# Patient Record
Sex: Female | Born: 1940 | Race: White | Hispanic: No | Marital: Married | Smoking: Never smoker
Health system: Southern US, Community
[De-identification: ages and names within clinical notes are randomized; demographics above are authoritative.]

## PROBLEM LIST (undated history)

## (undated) DIAGNOSIS — N912 Amenorrhea, unspecified: Secondary | ICD-10-CM

## (undated) DIAGNOSIS — I10 Essential (primary) hypertension: Secondary | ICD-10-CM

## (undated) DIAGNOSIS — M1990 Unspecified osteoarthritis, unspecified site: Secondary | ICD-10-CM

## (undated) DIAGNOSIS — K449 Diaphragmatic hernia without obstruction or gangrene: Secondary | ICD-10-CM

## (undated) DIAGNOSIS — E119 Type 2 diabetes mellitus without complications: Secondary | ICD-10-CM

## (undated) HISTORY — DX: Amenorrhea, unspecified: N91.2

## (undated) HISTORY — DX: Type 2 diabetes mellitus without complications: E11.9

## (undated) HISTORY — DX: Diaphragmatic hernia without obstruction or gangrene: K44.9

## (undated) HISTORY — DX: Essential (primary) hypertension: I10

## (undated) HISTORY — DX: Unspecified osteoarthritis, unspecified site: M19.90

---

## 1989-08-15 HISTORY — PX: ABDOMINAL HYSTERECTOMY: SHX81

## 1999-09-29 ENCOUNTER — Emergency Department (HOSPITAL_COMMUNITY): Admission: EM | Admit: 1999-09-29 | Discharge: 1999-09-29 | Payer: Self-pay | Admitting: Emergency Medicine

## 2000-08-21 ENCOUNTER — Other Ambulatory Visit: Admission: RE | Admit: 2000-08-21 | Discharge: 2000-08-21 | Payer: Self-pay | Admitting: Gynecology

## 2004-06-28 ENCOUNTER — Other Ambulatory Visit: Admission: RE | Admit: 2004-06-28 | Discharge: 2004-06-28 | Payer: Self-pay | Admitting: Gynecology

## 2014-03-11 ENCOUNTER — Other Ambulatory Visit: Payer: Self-pay | Admitting: Family Medicine

## 2014-03-11 DIAGNOSIS — Z1231 Encounter for screening mammogram for malignant neoplasm of breast: Secondary | ICD-10-CM

## 2014-04-02 ENCOUNTER — Other Ambulatory Visit: Payer: Self-pay | Admitting: Family Medicine

## 2014-04-02 DIAGNOSIS — M8580 Other specified disorders of bone density and structure, unspecified site: Secondary | ICD-10-CM

## 2014-04-07 ENCOUNTER — Encounter: Payer: Self-pay | Admitting: Cardiovascular Disease

## 2014-04-07 ENCOUNTER — Ambulatory Visit (INDEPENDENT_AMBULATORY_CARE_PROVIDER_SITE_OTHER): Payer: Medicare Other | Admitting: Cardiovascular Disease

## 2014-04-07 VITALS — BP 144/76 | HR 76 | Ht 67.0 in | Wt 220.1 lb

## 2014-04-07 DIAGNOSIS — I517 Cardiomegaly: Secondary | ICD-10-CM

## 2014-04-07 DIAGNOSIS — I10 Essential (primary) hypertension: Secondary | ICD-10-CM | POA: Insufficient documentation

## 2014-04-07 MED ORDER — CARVEDILOL 6.25 MG PO TABS: 6.2500 mg | ORAL_TABLET | Freq: Two times a day (BID) | ORAL | Status: DC

## 2014-04-07 NOTE — Patient Instructions (Addendum)
Your physician has requested that you have an echocardiogram. Echocardiography is a painless test that uses sound waves to create images of your heart. It provides your doctor with information about the size and shape of your heart and how well your heart's chambers and valves are working. This procedure takes approximately one hour. There are no restrictions for this procedure.  Your physician has recommended you make the following change in your medication:  START Coreg 6.25 mg twice daily - you may start on 1/2 tab twice daily x 1 week to "warm up" your body on this medication  Your physician recommends that you schedule a follow-up appointment in: 3 months with Dr. Elease Hashimoto

## 2014-04-07 NOTE — Assessment & Plan Note (Signed)
She was found to have cardiomegaly consistently on chest x-ray. She's had a long history of poorly controlled hypertension. We will get an echocardiogram for further assessment of her LV function and size. We'll start her on carvedilol to help with her hypertension control.

## 2014-04-07 NOTE — Progress Notes (Signed)
Janice Hester Date of Birth  1941-05-04       [facility] [address], [address]  [address], [address] [place], [place]  [address]   [place], [place]  [address] [phone]     [phone]   Fax  [phone]     Fax [phone]  Problem List: 1. Cardiomegaly 2. Hypertension  History of Present Illness:  Janice Hester is a 73 yo Coumadin recently for her physical. She has been having some fatigue. She had routine studies such as mammogram and also had an x-ray. She was noted to have some cardiomegaly on her x-ray.   She has a long hx of HTN ( 20+ years).  Systolic  BP is  typically 150-160 range. She watches her salt to a very slight degree.    She has a sausage biscuit once a week, bacon once a week.      Current Outpatient Prescriptions  Medication Sig Dispense Refill  . glimepiride (AMARYL) 2 MG tablet Take 2 mg by mouth daily with breakfast.      . ibuprofen (ADVIL,MOTRIN) 100 MG tablet Take 100 mg by mouth every 6 (six) hours as needed for fever.      Marland Kitchen LORazepam (ATIVAN) 1 MG tablet Take 1 mg by mouth every 8 (eight) hours. 1/2 TAB PRN      . pioglitazone (ACTOS) 30 MG tablet Take 30 mg by mouth daily.      . sitaGLIPtin-metformin (JANUMET) 50-1000 MG per tablet Take 1 tablet by mouth 2 (two) times daily with a meal.      . spironolactone (ALDACTONE) 25 MG tablet Take 25 mg by mouth daily.      Marland Kitchen triamterene-hydrochlorothiazide (MAXZIDE) 75-50 MG per tablet Take 1 tablet by mouth daily.      . valsartan (DIOVAN) 160 MG tablet Take 160 mg by mouth daily.       No current facility-administered medications for this visit.       Allergies  Allergen Reactions  . Codeine     NAUSEA AND FUNNY FEELING    Past Medical History  Diagnosis Date  . Diabetes mellitus without complication   . Hypertension   . Osteoarthritis     Past Surgical History  Procedure Laterality Date  . Abdominal hysterectomy  1991    History  Smoking  status  . Never Smoker   Smokeless tobacco  . Not on file    History  Alcohol Use: Not on file    Family History  Problem Relation Age of Onset  . CVA Mother   . Lung cancer Father   . CVA Sister   . Alzheimer's disease Sister     Reviw of Systems:  Reviewed in the HPI.  All other systems are negative.  Physical Exam: Blood pressure 144/76, pulse 76, height  (1.702 m), weight 220 lb 1.9 oz (99.846 kg). Wt Readings from Last 3 Encounters:  04/07/14 220 lb 1.9 oz (99.846 kg)     General: Well developed, well nourished, in no acute distress.  Head: Normocephalic, atraumatic, sclera non-icteric, mucus membranes are moist,   Neck: Supple. Carotids are 2 + without bruits. No JVD   Lungs: Clear   Heart: RR, normla S1S2  Abdomen: Soft, non-tender, non-distended with normal bowel sounds.  Msk:  Strength and tone are normal   Extremities: No clubbing or cyanosis. No edema.  Distal pedal pulses are 2+ and equal    Neuro:  CN II - XII intact.  Alert and oriented X 3.   Psych:  Normal  ECG: April 07, 2014:  NSR at 42.  Normal.   Assessment / Plan:

## 2014-04-07 NOTE — Assessment & Plan Note (Signed)
Her blood pressures typically in the 150-160 range. She tries to avoid salt but still eats quite a bit of salty foods. I've encouraged her to reduce her salt intake.  We'll start her on carvedilol 6.25 mg twice a day  I have asked her to exercise on a regular basis.  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

## 2014-04-09 ENCOUNTER — Ambulatory Visit
Admission: RE | Admit: 2014-04-09 | Discharge: 2014-04-09 | Disposition: A | Discharge status: Home / Self Care | Payer: Medicare Other | Source: Ambulatory Visit | Attending: Family Medicine | Admitting: Family Medicine

## 2014-04-09 ENCOUNTER — Other Ambulatory Visit: Payer: Self-pay | Admitting: Family Medicine

## 2014-04-09 DIAGNOSIS — E559 Vitamin D deficiency, unspecified: Secondary | ICD-10-CM

## 2014-04-14 ENCOUNTER — Ambulatory Visit (HOSPITAL_COMMUNITY): Payer: Medicare Other | Attending: Cardiovascular Disease

## 2014-04-14 DIAGNOSIS — I10 Essential (primary) hypertension: Secondary | ICD-10-CM

## 2014-04-14 DIAGNOSIS — I517 Cardiomegaly: Secondary | ICD-10-CM | POA: Insufficient documentation

## 2014-04-14 NOTE — Progress Notes (Signed)
2D Echo completed. 04/14/2014 

## 2014-07-01 ENCOUNTER — Ambulatory Visit: Payer: Medicare Other | Admitting: Cardiovascular Disease

## 2016-09-30 HISTORY — PX: REPLACEMENT TOTAL KNEE: SUR1224

## 2016-11-12 ENCOUNTER — Encounter: Payer: Self-pay | Admitting: Emergency Medicine

## 2016-11-12 ENCOUNTER — Emergency Department
Admission: EM | Admit: 2016-11-12 | Discharge: 2016-11-12 | Disposition: A | Discharge status: Home / Self Care | Payer: Medicare Other | Attending: Emergency Medicine | Admitting: Emergency Medicine

## 2016-11-12 ENCOUNTER — Emergency Department: Payer: Medicare Other

## 2016-11-12 DIAGNOSIS — S8992XA Unspecified injury of left lower leg, initial encounter: Secondary | ICD-10-CM | POA: Diagnosis present

## 2016-11-12 DIAGNOSIS — Y9389 Activity, other specified: Secondary | ICD-10-CM | POA: Diagnosis not present

## 2016-11-12 DIAGNOSIS — E119 Type 2 diabetes mellitus without complications: Secondary | ICD-10-CM | POA: Insufficient documentation

## 2016-11-12 DIAGNOSIS — I10 Essential (primary) hypertension: Secondary | ICD-10-CM | POA: Diagnosis not present

## 2016-11-12 DIAGNOSIS — W1839XA Other fall on same level, initial encounter: Secondary | ICD-10-CM | POA: Diagnosis not present

## 2016-11-12 DIAGNOSIS — S86812A Strain of other muscle(s) and tendon(s) at lower leg level, left leg, initial encounter: Secondary | ICD-10-CM

## 2016-11-12 DIAGNOSIS — Z791 Long term (current) use of non-steroidal anti-inflammatories (NSAID): Secondary | ICD-10-CM | POA: Insufficient documentation

## 2016-11-12 DIAGNOSIS — Y999 Unspecified external cause status: Secondary | ICD-10-CM | POA: Diagnosis not present

## 2016-11-12 DIAGNOSIS — Z7984 Long term (current) use of oral hypoglycemic drugs: Secondary | ICD-10-CM | POA: Insufficient documentation

## 2016-11-12 DIAGNOSIS — Y929 Unspecified place or not applicable: Secondary | ICD-10-CM | POA: Diagnosis not present

## 2016-11-12 DIAGNOSIS — Z96652 Presence of left artificial knee joint: Secondary | ICD-10-CM | POA: Insufficient documentation

## 2016-11-12 NOTE — ED Notes (Signed)
Family at bedside. 

## 2016-11-12 NOTE — ED Notes (Signed)
Family updated on scan results

## 2016-11-12 NOTE — ED Triage Notes (Signed)
Patient arrived via EMS after falling on her left knee at [facility] after eating tonight.  Pt states there were many small children playing around the exit and she does not recall if she tripped over one of them or not.  Pt is AOx4 at this time.  She is nearly 6 weeks left knee replacement post op and is due a checkup on Tuesday.  Pt states she is not in any pain but is unable to move her leg or knee at this time.  MD at bedside during triage.  Pt has a hx of hypertension and diabetes.  She states she has not taken any of her home medication today because she "is very busy".

## 2016-11-12 NOTE — ED Provider Notes (Signed)
[facility] Emergency Department Provider Note   ____________________________________________    I have reviewed the triage vital signs and the nursing notes.   HISTORY  Chief Complaint Fall     HPI Janice Hester is a 76 y.o. female who presents after a fall. Patient reports she tripped over someone's foot and landed on her left knee. She reports she recent had a total knee replacement 6 weeks ago at [employer]. She denies other injuries. She reports she is unable to range her left knee.   Past Medical History:  Diagnosis Date  . Diabetes mellitus without complication (HCC)   . Hypertension   . Osteoarthritis     Patient Active Problem List   Diagnosis Date Noted  . HTN (hypertension) 04/07/2014  . Cardiomegaly 04/07/2014    Past Surgical History:  Procedure Laterality Date  . ABDOMINAL HYSTERECTOMY  1991  . 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 Authorizing Provider  carvedilol (COREG) 6.25 MG tablet Take 1 tablet (6.25 mg total) by mouth 2 (two) times daily. 04/07/14   Vesta Mixer, MD  glimepiride (AMARYL) 2 MG tablet Take 2 mg by mouth daily with breakfast.    Historical Provider, MD  ibuprofen (ADVIL,MOTRIN) 100 MG tablet Take 100 mg by mouth every 6 (six) hours as needed for fever.    Historical Provider, MD  LORazepam (ATIVAN) 1 MG tablet Take 1 mg by mouth every 8 (eight) hours. 1/2 TAB PRN    Historical Provider, MD  pioglitazone (ACTOS) 30 MG tablet Take 30 mg by mouth daily.    Historical Provider, MD  sitaGLIPtin-metformin (JANUMET) 50-1000 MG per tablet Take 1 tablet by mouth 2 (two) times daily with a meal.    Historical Provider, MD  spironolactone (ALDACTONE) 25 MG tablet Take 25 mg by mouth daily.    Historical Provider, MD  triamterene-hydrochlorothiazide (MAXZIDE) 75-50 MG per tablet Take 1 tablet by mouth daily.    Historical  Provider, MD  valsartan (DIOVAN) 160 MG tablet Take 160 mg by mouth daily.    Historical Provider, MD     Allergies Codeine  Family History  Problem Relation Age of Onset  . CVA Mother   . Lung cancer Father   . CVA Sister   . Alzheimer's disease Sister     Social History Social History  Substance Use Topics  . Smoking status: Never Smoker  . Smokeless tobacco: Never Used  . Alcohol use No    Review of Systems  Constitutional: No Dizziness Eyes: No visual changes.  ENT: No neck pain Cardiovascular: Denies chest pain. Respiratory: Denies shortness of breath. Gastrointestinal: No abdominal pain.    Musculoskeletal: Knee pain as above Skin: Left knee abrasion Neurological: Negative for headaches or weakness  10-point ROS otherwise negative.  ____________________________________________   PHYSICAL EXAM:  VITAL SIGNS: ED Triage Vitals  Enc Vitals Group     BP --      Pulse --      Resp --      Temp --      Temp src --      SpO2 11/12/16 1958 97 %     Weight 11/12/16 2001 227 lb 4.8 oz (103.1 kg)     Height --      Head Circumference --      Peak Flow --      Pain Score --  Pain Loc --      Pain Edu? --      Excl. in GC? --     Constitutional: Alert and oriented. No acute distress. Pleasant and interactive Eyes: Conjunctivae are normal.  Head: Atraumatic. Nose: No congestion/rhinnorhea. Mouth/Throat: Mucous membranes are moist.   Neck:  Painless ROM Cardiovascular: Normal rate, regular rhythm.  Good peripheral circulation. Respiratory: Normal respiratory effort.  No retractions.  Gastrointestinal: Soft and nontender. No distention.  No CVA tenderness. Genitourinary: deferred Musculoskeletal: Left knee with mild diffuse swelling, faint ecchymosis, small abrasion inferior portion of left knee. Unable to extend at the knee.  Warm and well perfused Neurologic:  Normal speech and language. No gross focal neurologic deficits are appreciated.  Skin:   Skin is warm, dry. No rash noted. Psychiatric: Mood and affect are normal. Speech and behavior are normal.  ____________________________________________   LABS (all labs ordered are listed, but only abnormal results are displayed)  Labs Reviewed - No data to display ____________________________________________  EKG  None ____________________________________________  RADIOLOGY  No acute abnormality on knee x-ray ____________________________________________   PROCEDURES  Procedure(s) performed: No    Critical Care performed: No ____________________________________________   INITIAL IMPRESSION / ASSESSMENT AND PLAN / ED COURSE  Pertinent labs & imaging results that were available during my care of the patient were reviewed by me and considered in my medical decision making (see chart for details).  Patient status post mechanical fall. Unable to range knee, we will start with x-rays of the left knee. She does not want pain medication this time  Discussed x-ray results and exam with orthopedist Dr. Loralie Champagne. We agree likely patellar tendon disruption. He recommends knee immobilizer, patient has f/u with her orthopedist in 2 days. She is quite comfortable with this plan. Has pain medications at home if needed. Recommend RICE    ____________________________________________   FINAL CLINICAL IMPRESSION(S) / ED DIAGNOSES  Final diagnoses:  Patellar tendon rupture, left, initial encounter      NEW MEDICATIONS STARTED DURING THIS VISIT:  New Prescriptions   No medications on file     Note:  This document was prepared using Dragon voice recognition software and may include unintentional dictation errors.    Jene Every, MD 11/12/16 2222

## 2016-11-13 DIAGNOSIS — Z96652 Presence of left artificial knee joint: Secondary | ICD-10-CM | POA: Diagnosis not present

## 2016-11-13 DIAGNOSIS — E119 Type 2 diabetes mellitus without complications: Secondary | ICD-10-CM | POA: Diagnosis not present

## 2016-11-13 DIAGNOSIS — I10 Essential (primary) hypertension: Secondary | ICD-10-CM | POA: Diagnosis not present

## 2016-11-13 DIAGNOSIS — S8990XA Unspecified injury of unspecified lower leg, initial encounter: Secondary | ICD-10-CM | POA: Diagnosis not present

## 2016-11-13 DIAGNOSIS — E1122 Type 2 diabetes mellitus with diabetic chronic kidney disease: Secondary | ICD-10-CM | POA: Diagnosis not present

## 2016-11-14 DIAGNOSIS — E1122 Type 2 diabetes mellitus with diabetic chronic kidney disease: Secondary | ICD-10-CM | POA: Diagnosis not present

## 2016-11-14 DIAGNOSIS — Z96652 Presence of left artificial knee joint: Secondary | ICD-10-CM | POA: Diagnosis not present

## 2016-11-14 DIAGNOSIS — I10 Essential (primary) hypertension: Secondary | ICD-10-CM | POA: Diagnosis not present

## 2016-11-14 DIAGNOSIS — S8990XA Unspecified injury of unspecified lower leg, initial encounter: Secondary | ICD-10-CM | POA: Diagnosis not present

## 2017-11-28 ENCOUNTER — Other Ambulatory Visit: Payer: Self-pay | Admitting: Family Medicine

## 2017-11-28 ENCOUNTER — Ambulatory Visit
Admission: RE | Admit: 2017-11-28 | Discharge: 2017-11-28 | Disposition: A | Discharge status: Home / Self Care | Payer: Medicare Other | Source: Ambulatory Visit | Attending: Family Medicine | Admitting: Family Medicine

## 2017-11-28 DIAGNOSIS — R059 Cough, unspecified: Secondary | ICD-10-CM

## 2017-11-28 DIAGNOSIS — R05 Cough: Secondary | ICD-10-CM

## 2018-01-09 DIAGNOSIS — Z01818 Encounter for other preprocedural examination: Secondary | ICD-10-CM

## 2018-07-09 DIAGNOSIS — D649 Anemia, unspecified: Secondary | ICD-10-CM

## 2018-07-09 DIAGNOSIS — E1122 Type 2 diabetes mellitus with diabetic chronic kidney disease: Secondary | ICD-10-CM

## 2018-07-09 DIAGNOSIS — E669 Obesity, unspecified: Secondary | ICD-10-CM

## 2018-07-09 DIAGNOSIS — I10 Essential (primary) hypertension: Secondary | ICD-10-CM | POA: Diagnosis not present

## 2018-07-09 DIAGNOSIS — E119 Type 2 diabetes mellitus without complications: Secondary | ICD-10-CM

## 2018-07-09 DIAGNOSIS — N179 Acute kidney failure, unspecified: Secondary | ICD-10-CM | POA: Diagnosis not present

## 2018-07-11 ENCOUNTER — Ambulatory Visit
Admission: RE | Admit: 2018-07-11 | Discharge: 2018-07-11 | Disposition: A | Discharge status: Home / Self Care | Payer: Medicare Other | Source: Ambulatory Visit | Attending: Family Medicine | Admitting: Family Medicine

## 2018-07-11 ENCOUNTER — Other Ambulatory Visit: Payer: Self-pay | Admitting: Family Medicine

## 2018-07-11 DIAGNOSIS — D649 Anemia, unspecified: Secondary | ICD-10-CM

## 2018-07-11 DIAGNOSIS — R0600 Dyspnea, unspecified: Secondary | ICD-10-CM

## 2019-03-19 IMAGING — CR DG CHEST 2V
2 series · 2 of 2 positions shown · non-contrast
Comparison: CT chest and two-view chest x-ray [REDACTED]
07/09/2018.

CLINICAL DATA: Productive cough and wheezing. Dyspnea for 2 weeks.
Anemia.

EXAM:
CHEST - 2 VIEW

[w chest pa]
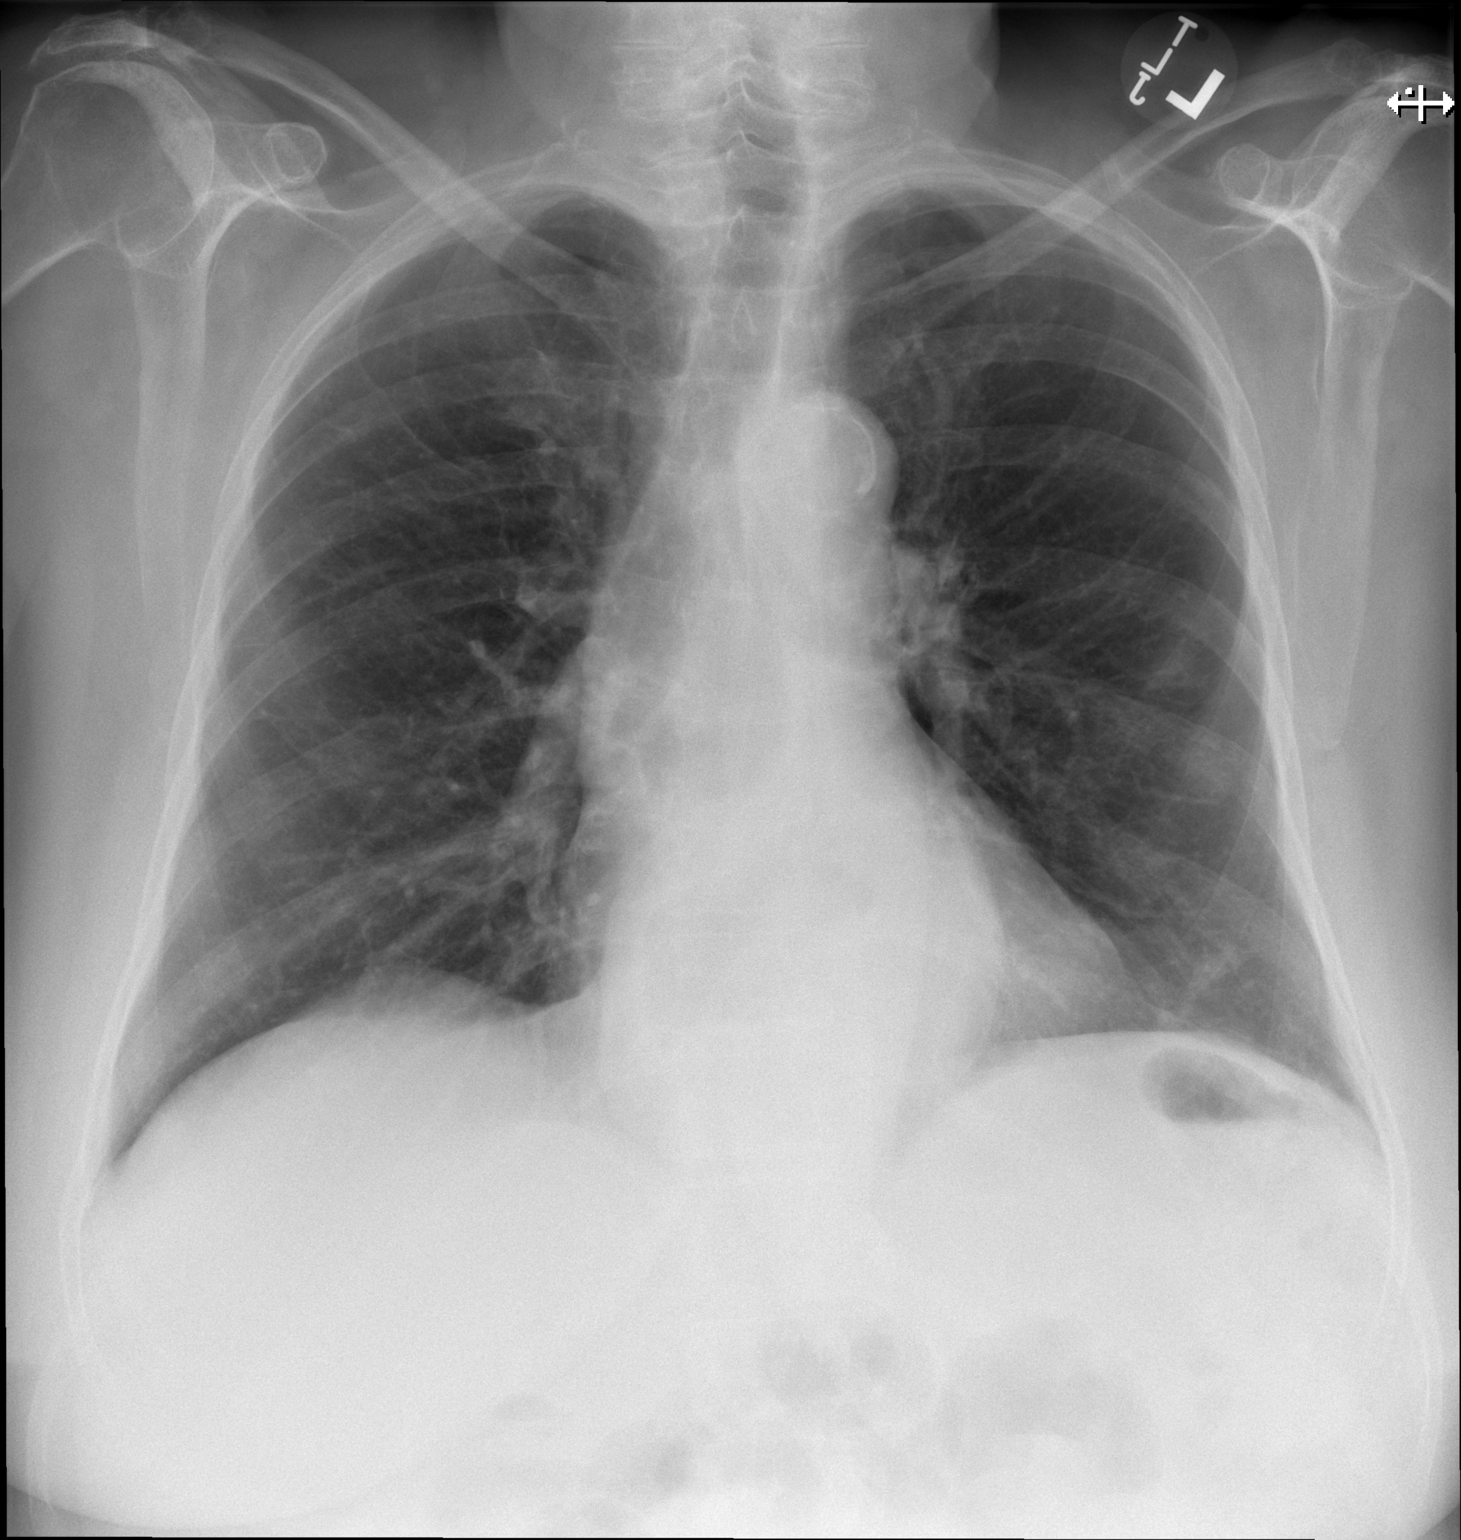

[w chest lat]
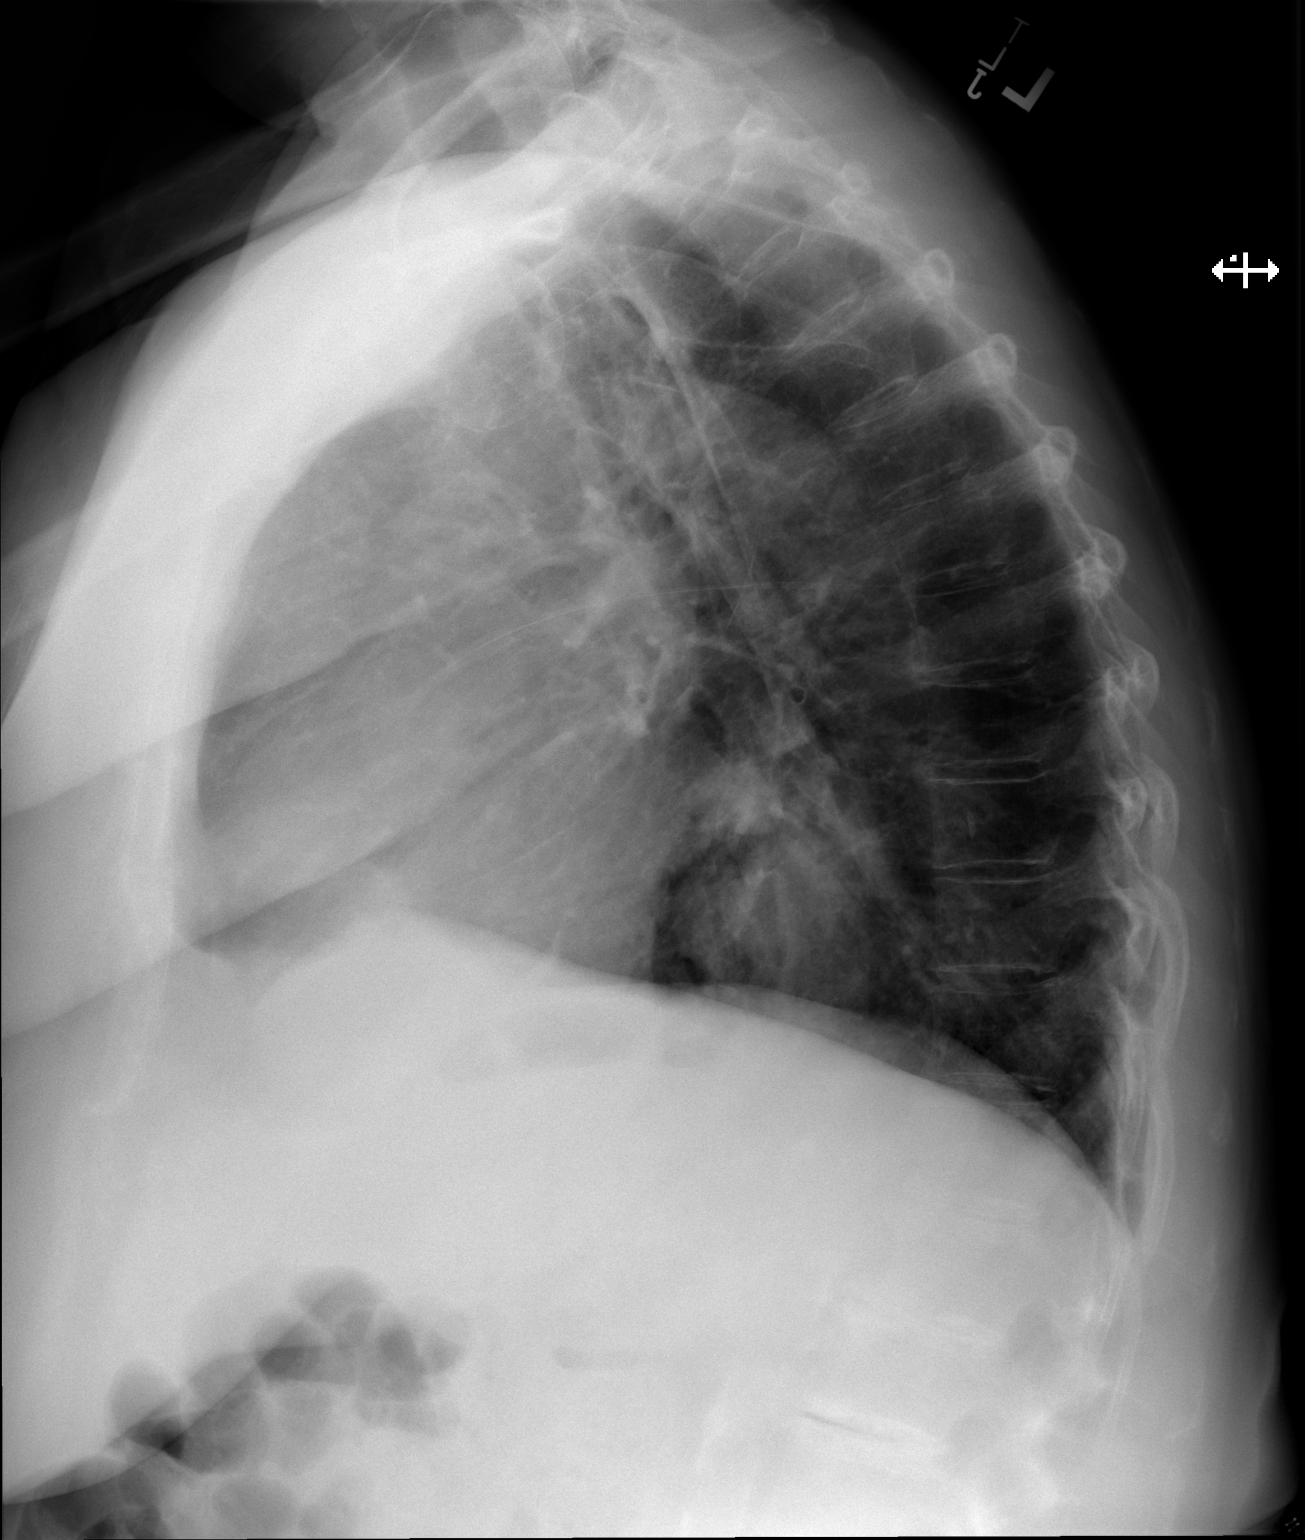

[2 of 2 positions shown; findings below may reference images not displayed]

FINDINGS: Heart size normal. Aortic atherosclerosis is again seen. There is no
edema or effusion. A spiculated nodule is again noted within the
left lower lobe. Scarring is present at the left base. No focal
airspace disease is present. A large hiatal hernia is present. The
visualized soft tissues and bony thorax are otherwise unremarkable.
IMPRESSION: 1. No acute cardiopulmonary disease or significant interval change.
2. Spiculated nodule in the left lower lobe is stable. Consider 1 of
the following 3 months for both low risk and high-risk individuals:
A. Repeat chest CT, B. Follow-up PET-CT, or C. Tissue sampling.
3. Stable scarring at the left base.
4. Aortic atherosclerosis.

## 2019-04-02 ENCOUNTER — Emergency Department (HOSPITAL_COMMUNITY): Payer: Medicare Other

## 2019-04-02 ENCOUNTER — Other Ambulatory Visit: Payer: Self-pay

## 2019-04-02 ENCOUNTER — Emergency Department (HOSPITAL_COMMUNITY)
Admission: EM | Admit: 2019-04-02 | Discharge: 2019-04-02 | Disposition: A | Discharge status: Home / Self Care | Payer: Medicare Other | Attending: Emergency Medicine | Admitting: Emergency Medicine

## 2019-04-02 ENCOUNTER — Encounter (HOSPITAL_COMMUNITY): Payer: Self-pay

## 2019-04-02 DIAGNOSIS — E119 Type 2 diabetes mellitus without complications: Secondary | ICD-10-CM | POA: Insufficient documentation

## 2019-04-02 DIAGNOSIS — I10 Essential (primary) hypertension: Secondary | ICD-10-CM | POA: Diagnosis not present

## 2019-04-02 DIAGNOSIS — R1013 Epigastric pain: Secondary | ICD-10-CM | POA: Diagnosis present

## 2019-04-02 DIAGNOSIS — Z79899 Other long term (current) drug therapy: Secondary | ICD-10-CM | POA: Insufficient documentation

## 2019-04-02 DIAGNOSIS — Z7984 Long term (current) use of oral hypoglycemic drugs: Secondary | ICD-10-CM | POA: Insufficient documentation

## 2019-04-02 LAB — CBC
HCT: 37.2 % (ref 36.0–46.0)
Hemoglobin: 12.1 g/dL (ref 12.0–15.0)
MCH: 31.3 pg (ref 26.0–34.0)
MCHC: 32.5 g/dL (ref 30.0–36.0)
MCV: 96.1 fL (ref 80.0–100.0)
Platelets: 265 10*3/uL (ref 150–400)
RBC: 3.87 MIL/uL (ref 3.87–5.11)
RDW: 12.7 % (ref 11.5–15.5)
WBC: 6.4 10*3/uL (ref 4.0–10.5)
nRBC: 0 % (ref 0.0–0.2)

## 2019-04-02 LAB — BASIC METABOLIC PANEL
Anion gap: 10 (ref 5–15)
BUN: 23 mg/dL (ref 8–23)
CO2: 23 mmol/L (ref 22–32)
Calcium: 9 mg/dL (ref 8.9–10.3)
Chloride: 102 mmol/L (ref 98–111)
Creatinine, Ser: 1.44 mg/dL — ABNORMAL HIGH (ref 0.44–1.00)
GFR calc Af Amer: 40 mL/min — ABNORMAL LOW (ref >60)
GFR calc non Af Amer: 34 mL/min — ABNORMAL LOW (ref >60)
Glucose, Bld: 277 mg/dL — ABNORMAL HIGH (ref 70–99)
Potassium: 5.2 mmol/L — ABNORMAL HIGH (ref 3.5–5.1)
Sodium: 135 mmol/L (ref 135–145)

## 2019-04-02 LAB — HEPATIC FUNCTION PANEL
ALT: 16 U/L (ref 0–44)
AST: 24 U/L (ref 15–41)
Albumin: 3.9 g/dL (ref 3.5–5.0)
Alkaline Phosphatase: 94 U/L (ref 38–126)
Bilirubin, Direct: 0.2 mg/dL (ref 0.0–0.2)
Indirect Bilirubin: 0.5 mg/dL (ref 0.3–0.9)
Total Bilirubin: 0.7 mg/dL (ref 0.3–1.2)
Total Protein: 6.7 g/dL (ref 6.5–8.1)

## 2019-04-02 LAB — TROPONIN I (HIGH SENSITIVITY)
Troponin I (High Sensitivity): 5 ng/L (ref <18)
Troponin I (High Sensitivity): 7 ng/L (ref <18)

## 2019-04-02 LAB — LIPASE, BLOOD: Lipase: 44 U/L (ref 11–51)

## 2019-04-02 NOTE — ED Notes (Signed)
Patient transported to Ultrasound 

## 2019-04-02 NOTE — ED Provider Notes (Signed)
[facility] Provider Note   CSN: [id number] Arrival date & time: 04/02/19  1114     History   Chief Complaint Chief Complaint  Patient presents with  . Chest Pain    HPI Janice Hester is a 78 y.o. female.     Patient is a 78 year old female with past medical history of hypertension and diabetes.  She presents today for evaluation of epigastric pain.  Patient states that she was eating a bologna sandwich this morning when she developed discomfort to her epigastrium.  She described this as a cramping with no associated nausea, shortness of breath, or diaphoresis.  She denies any radiation to the arm or jaw.  This pain lasted approximately 45 minutes and resolved spontaneously in route here.  She is currently symptom-free.  Patient denies any recent exertional symptoms.  She tells me that she has no prior cardiac history.  The history is provided by the patient.  Chest Pain Pain location:  Epigastric Pain quality comment:  Cramping Pain radiates to:  Does not radiate Pain severity:  Moderate Onset quality:  Sudden Duration:  45 minutes Timing:  Constant Progression:  Resolved Chronicity:  New Relieved by: Time. Exacerbated by: Eating. Ineffective treatments:  None tried Associated symptoms: abdominal pain     Past Medical History:  Diagnosis Date  . Diabetes mellitus without complication ([facility])   . Hypertension   . Osteoarthritis     Patient Active Problem List   Diagnosis Date Noted  . HTN (hypertension) 04/07/2014  . Cardiomegaly 04/07/2014    Past Surgical History:  Procedure Laterality Date  . ABDOMINAL HYSTERECTOMY  1991  . 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      Home Medications    Prior to Admission medications   Medication Sig Start Date End Date Taking? Authorizing Provider  carvedilol (COREG) 6.25 MG tablet Take 1 tablet (6.25 mg total) by mouth 2 (two)  times daily. 04/07/14   Nahser, Wonda Cheng, MD  glimepiride (AMARYL) 2 MG tablet Take 2 mg by mouth daily with breakfast.    [provider]  ibuprofen (ADVIL,MOTRIN) 100 MG tablet Take 100 mg by mouth every 6 (six) hours as needed for fever.    [provider]  LORazepam (ATIVAN) 1 MG tablet Take 1 mg by mouth every 8 (eight) hours. 1/2 TAB PRN    [provider]  pioglitazone (ACTOS) 30 MG tablet Take 30 mg by mouth daily.    [provider]  sitaGLIPtin-metformin (JANUMET) 50-1000 MG per tablet Take 1 tablet by mouth 2 (two) times daily with a meal.    [provider]  spironolactone (ALDACTONE) 25 MG tablet Take 25 mg by mouth daily.    [provider]  triamterene-hydrochlorothiazide (MAXZIDE) 75-50 MG per tablet Take 1 tablet by mouth daily.    [provider]  valsartan (DIOVAN) 160 MG tablet Take 160 mg by mouth daily.    [provider]    Family History Family History  Problem Relation Age of Onset  . CVA Mother   . Lung cancer Father   . CVA Sister   . Alzheimer's disease Sister     Social History Social History   Tobacco Use  . Smoking status: Never Smoker  . Smokeless tobacco: Never Used  Substance Use Topics  . Alcohol use: No  . Drug use: Never     Allergies  Codeine   Review of Systems Review of Systems  Cardiovascular: Positive for chest pain.  Gastrointestinal: Positive for abdominal pain.  All other systems reviewed and are negative.    Physical Exam Updated Vital Signs BP (!) 164/79 (BP Location: Left Arm)   Pulse 96   Temp 98.7 F (37.1 C) (Oral)   Resp 18   Ht 5\' 7"  (1.702 m)   Wt 88.9 kg   SpO2 93%   BMI 30.70 kg/m   Physical Exam Vitals signs and nursing note reviewed.  Constitutional:      General: She is not in acute distress.    Appearance: She is well-developed. She is not diaphoretic.  HENT:     Head: Normocephalic and atraumatic.  Neck:      Musculoskeletal: Normal range of motion and neck supple.  Cardiovascular:     Rate and Rhythm: Normal rate and regular rhythm.     Heart sounds: No murmur. No friction rub. No gallop.   Pulmonary:     Effort: Pulmonary effort is normal. No respiratory distress.     Breath sounds: Normal breath sounds. No wheezing.  Abdominal:     General: Bowel sounds are normal. There is no distension.     Palpations: Abdomen is soft.     Tenderness: There is no abdominal tenderness.  Musculoskeletal: Normal range of motion.     Right lower leg: She exhibits no tenderness. No edema.     Left lower leg: She exhibits no tenderness. No edema.  Skin:    General: Skin is warm and dry.  Neurological:     Mental Status: She is alert and oriented to person, place, and time.      ED Treatments / Results  Labs (all labs ordered are listed, but only abnormal results are displayed) Labs Reviewed  CBC  BASIC METABOLIC PANEL  LIPASE, BLOOD  HEPATIC FUNCTION PANEL  TROPONIN I (HIGH SENSITIVITY)    EKG EKG Interpretation  Date/Time:  Tuesday April 02 2019 11:17:55 EDT Ventricular Rate:  95 PR Interval:    QRS Duration: 94 QT Interval:  350 QTC Calculation: 440 R Axis:   28 Text Interpretation:  Sinus rhythm Low voltage, precordial leads Artifact in lead(s) V6 ECG OTHERWISE WITHIN NORMAL LIMITS Confirmed by Geoffery LyonseLo, Faatima Tench ([id number]) on 04/02/2019 11:24:04 AM   Radiology No results found.  Procedures Procedures (including critical care time)  Medications Ordered in ED Medications - No data to display   Initial Impression / Assessment and Plan / ED Course  I have reviewed the triage vital signs and the nursing notes.  Pertinent labs & imaging results that were available during my care of the patient were reviewed by me and considered in my medical decision making (see chart for details).  Patient is a 78 year old female presenting with complaints of epigastric pain that began while eating a  bologna sandwich this morning.  This lasted for approximately 45 minutes then resolved.  She is currently symptom-free upon presentation.  Her abdominal exam is benign and work-up reveals no significant abnormality.  She did have a chest x-ray and ultrasound of the right upper quadrant, both of which were essentially normal.  Patient has no prior cardiac history and nothing in the work-up today suggests a cardiac etiology.  Her EKG is unchanged and troponin x2 is negative.  I highly doubt a cardiac etiology.  I suspect the patient had some sort of an episode of either esophageal spasm or possibly indigestion, and do not feel as  though there is anything emergent.  Patient to be discharged, to return as needed for any problems.  Final Clinical Impressions(s) / ED Diagnoses   Final diagnoses:  Epigastric pain    ED Discharge Orders    None       Geoffery Lyonselo, Marketa Midkiff, MD 04/02/19 1437

## 2019-04-02 NOTE — Discharge Instructions (Addendum)
Return to the emergency department if you develop worsening pain, high fever, difficulty breathing, bloody stools, or other new and concerning symptoms.

## 2019-04-02 NOTE — ED Triage Notes (Signed)
Pt brought by [facility] from home, started having midsternal sharp, crushing chest pain at 0930 this morning. Pt rated pain 9/10. Pt took ASA 324mg , EMS gave 1 nitro then pt rated pain 2/10. Initial bp for EMS was 220/100, initial EKG was sinus tach, after nitro NSR. Pt is A&Ox4. NSR on monitor. Pt breathing normal, pt does not appear to be in any distress.

## 2019-12-09 IMAGING — US ULTRASOUND ABDOMEN LIMITED
1 series · 14 of 25 positions shown · non-contrast
Comparison: CT 09/19/2018

CLINICAL DATA: Epigastric pain

EXAM:
ULTRASOUND ABDOMEN LIMITED RIGHT UPPER QUADRANT

[Series 1: ultrasound abdomen limited · 14 of 29 slices shown]
[im 1/29]
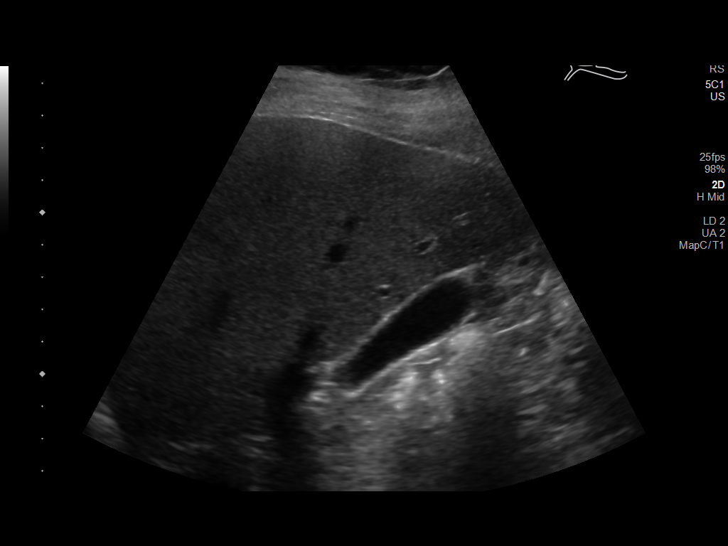
[im 3/29]
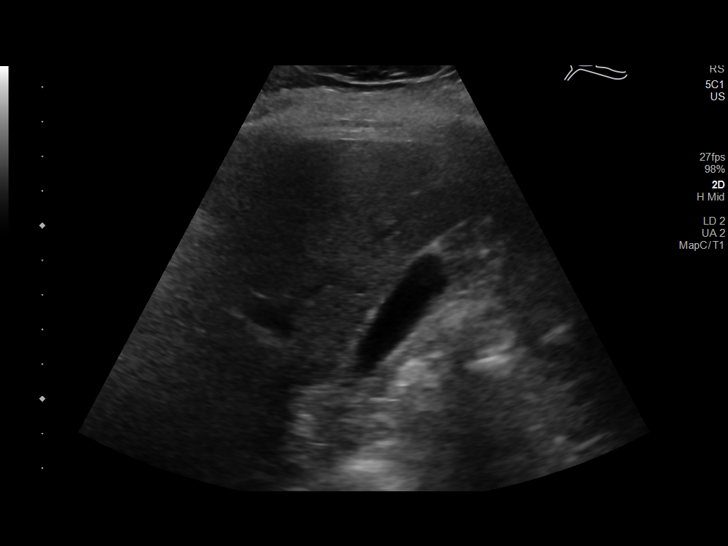
[im 5/29]
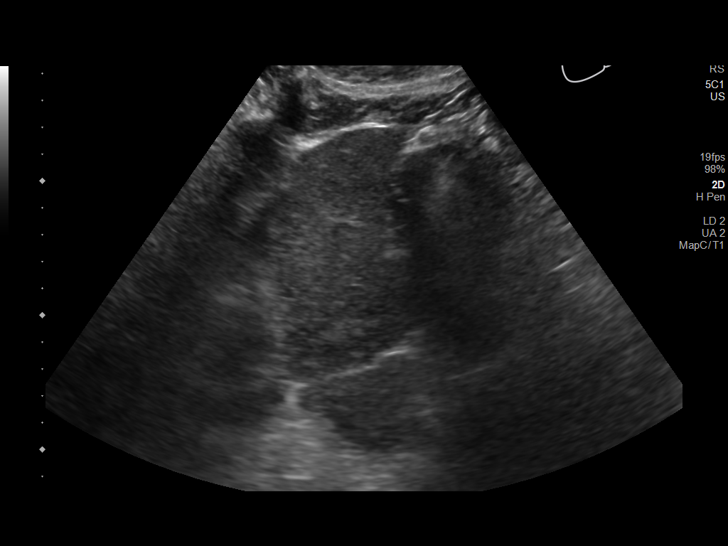
[im 8/29]
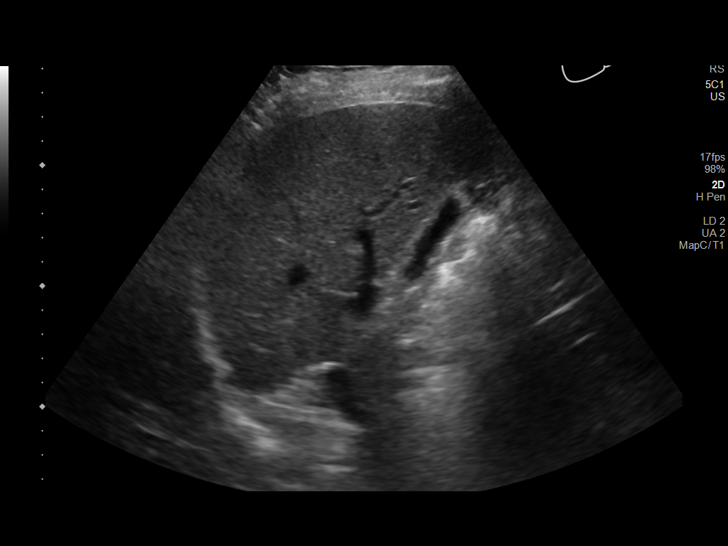
[im 10/29]
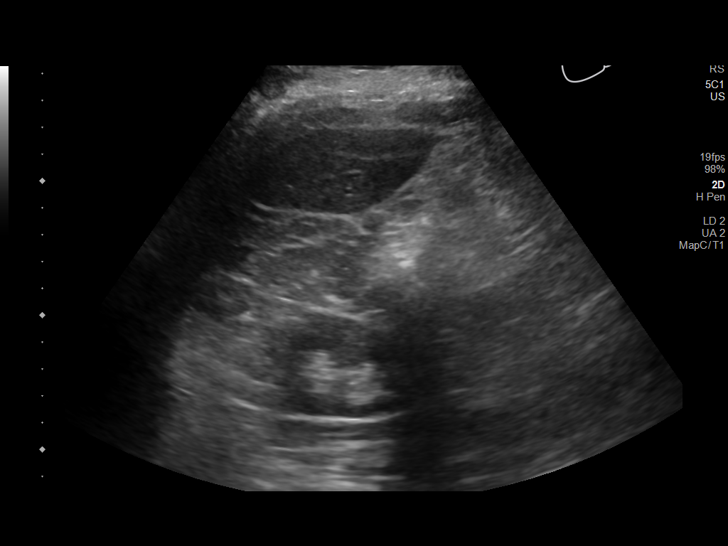
[im 11/29]
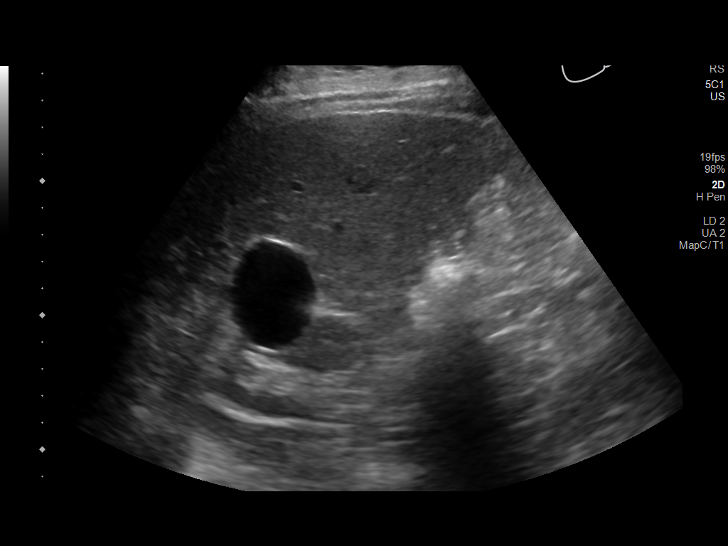
[im 13/29]
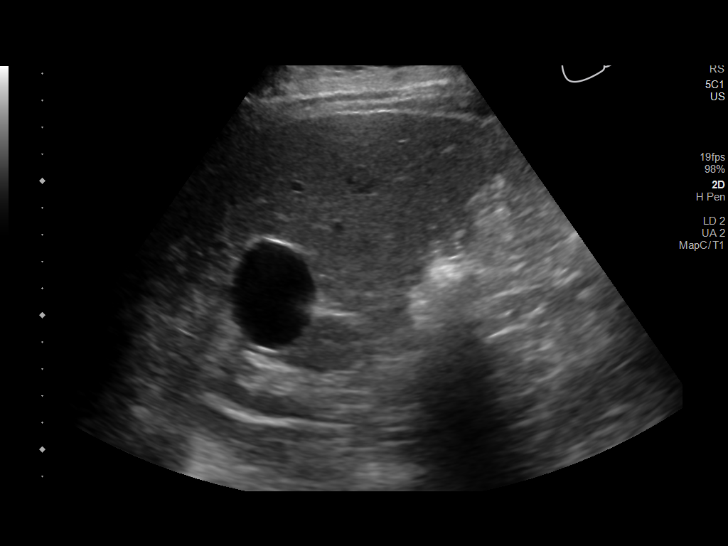
[im 16/29]
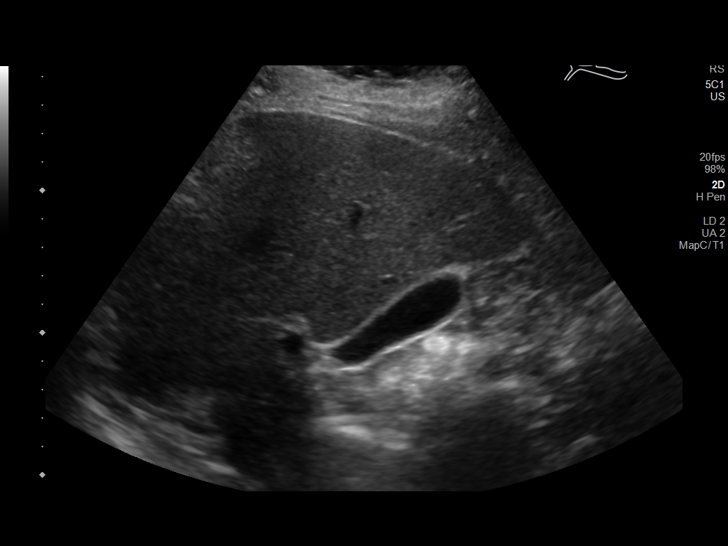
[im 18/29]
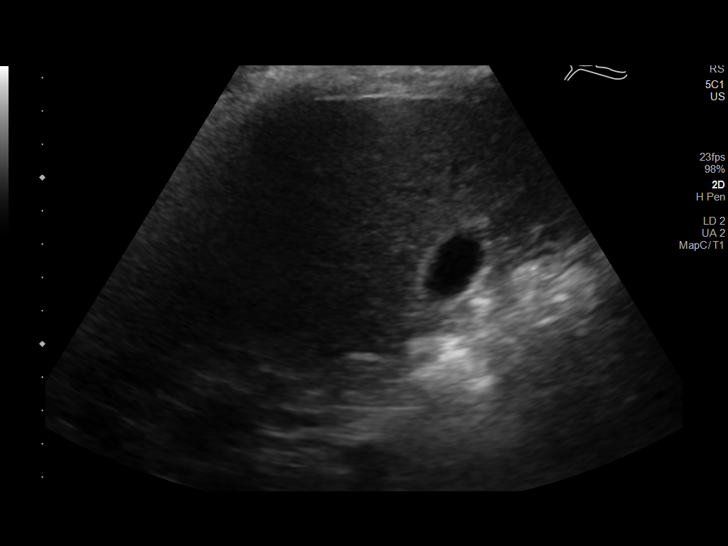
[im 19/29]
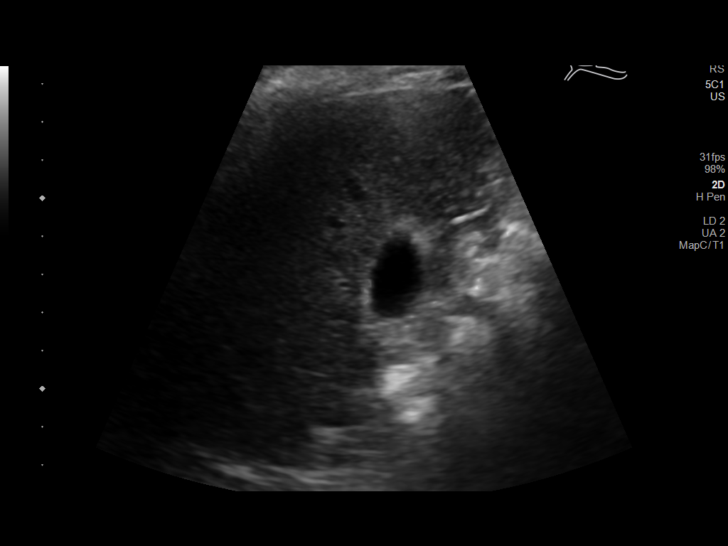
[im 22/29]
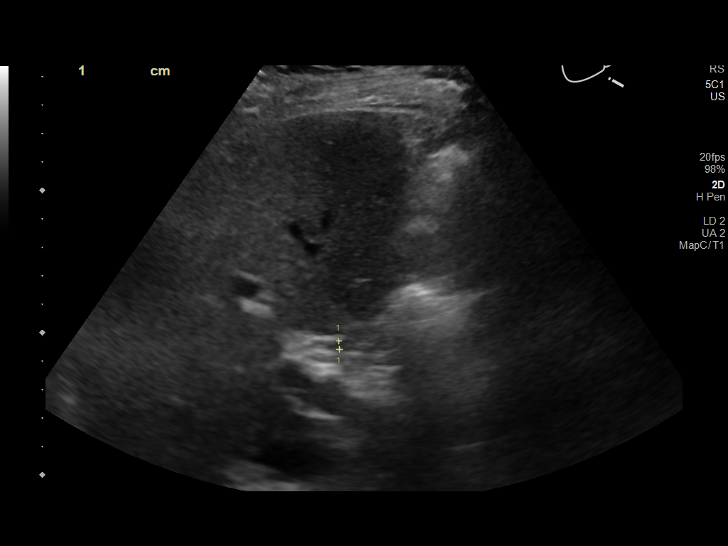
[im 24/29]
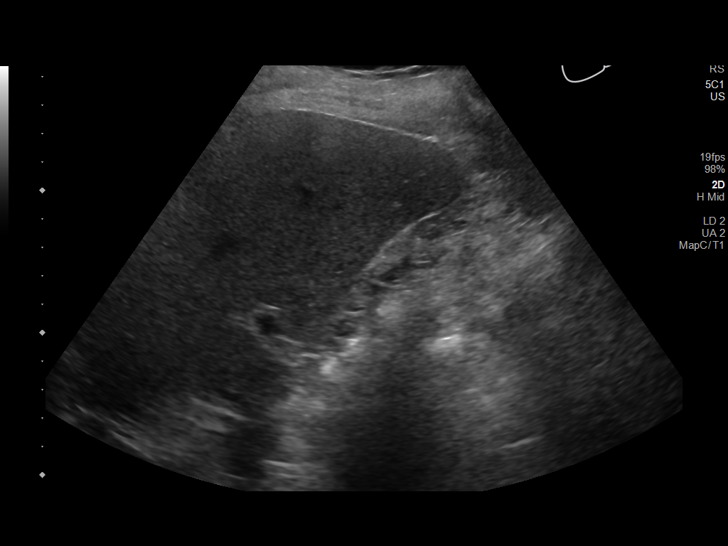
[im 26/29]
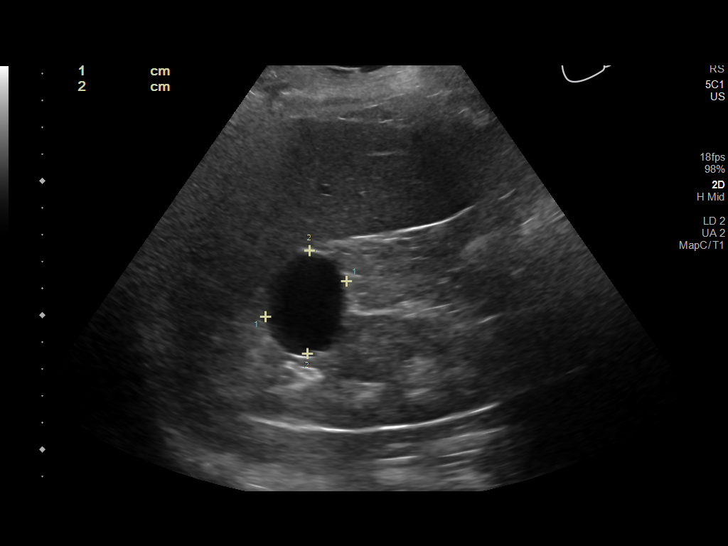
[im 29/29]
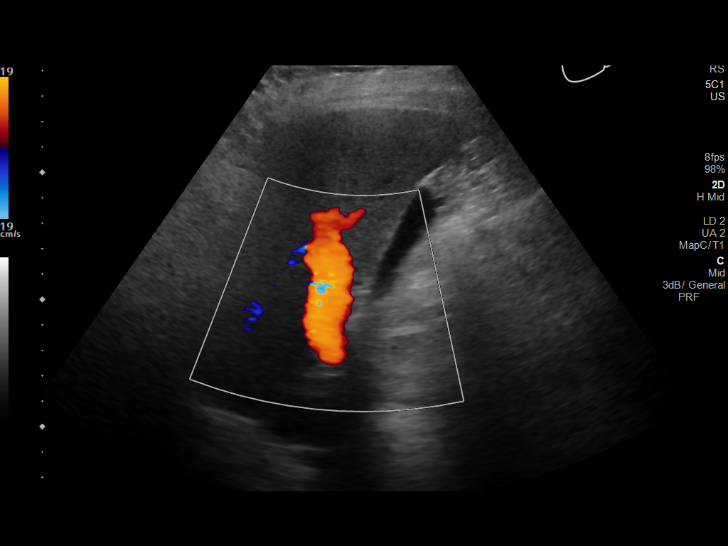

[14 of 25 positions shown; findings below may reference images not displayed]

FINDINGS: Gallbladder:

Gallbladder is partially contracted. No gallstones or wall
thickening visualized. No sonographic Murphy sign noted by
sonographer.

Common bile duct:

Diameter: 3 mm

Liver:

No focal lesion identified. Mildly increased parenchymal
echogenicity. Portal vein is patent on color Doppler imaging with
normal direction of blood flow towards the liver.

Other: Superior pole right renal cyst measuring up to 3.8 cm, as
seen on recent CT.
IMPRESSION: 1. The echogenicity of the liver is mildly increased. This is a
nonspecific finding but is most commonly seen with fatty
infiltration of the liver. There are no obvious focal liver lesions.
2. Gallbladder is partially contracted however normal in appearance
for the degree of distension.

## 2019-12-09 IMAGING — CR CHEST - 2 VIEW
2 series · 2 of 2 positions shown · non-contrast
Comparison: 07/11/2018

CLINICAL DATA: Mid sternal chest pain

EXAM:
CHEST - 2 VIEW

[chest pa]
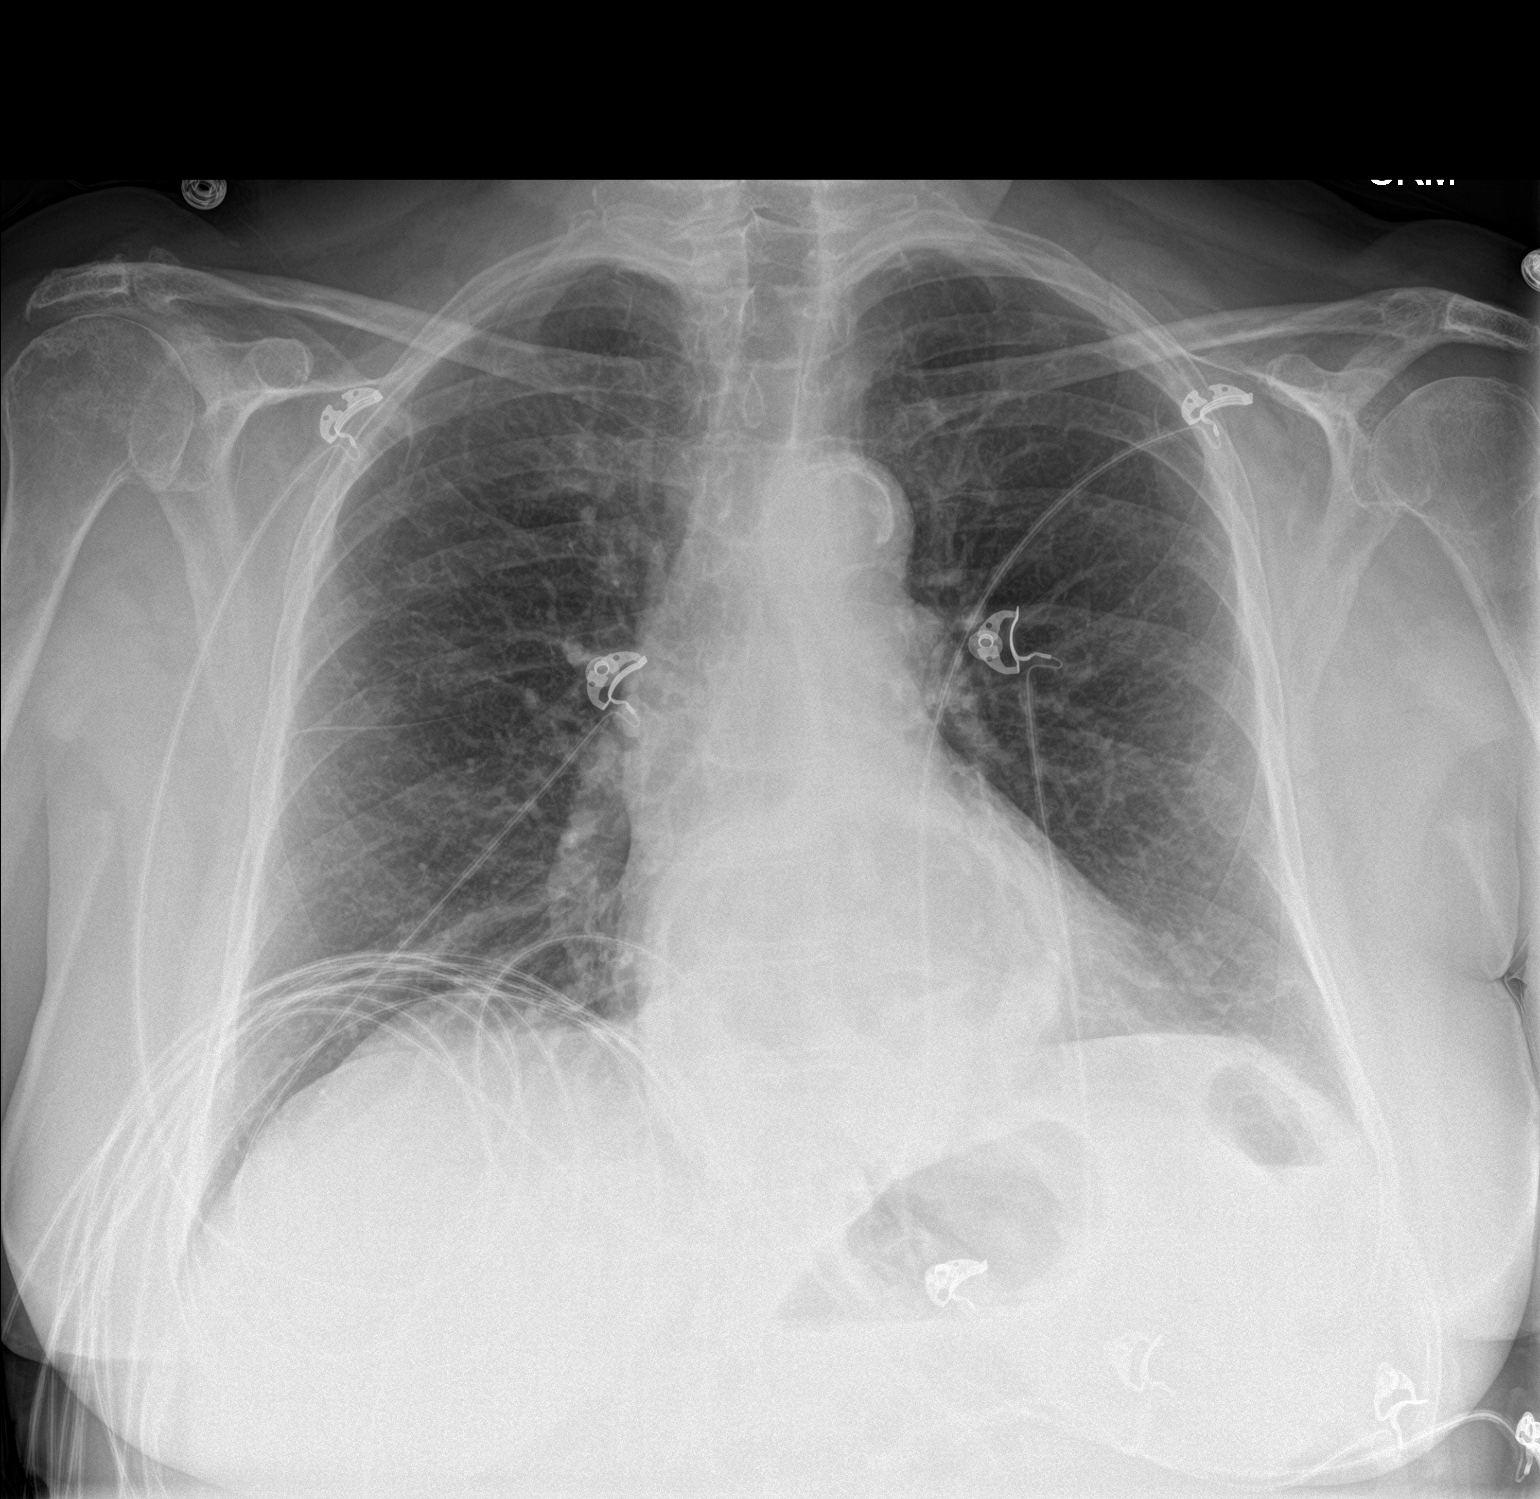

[chest lat]
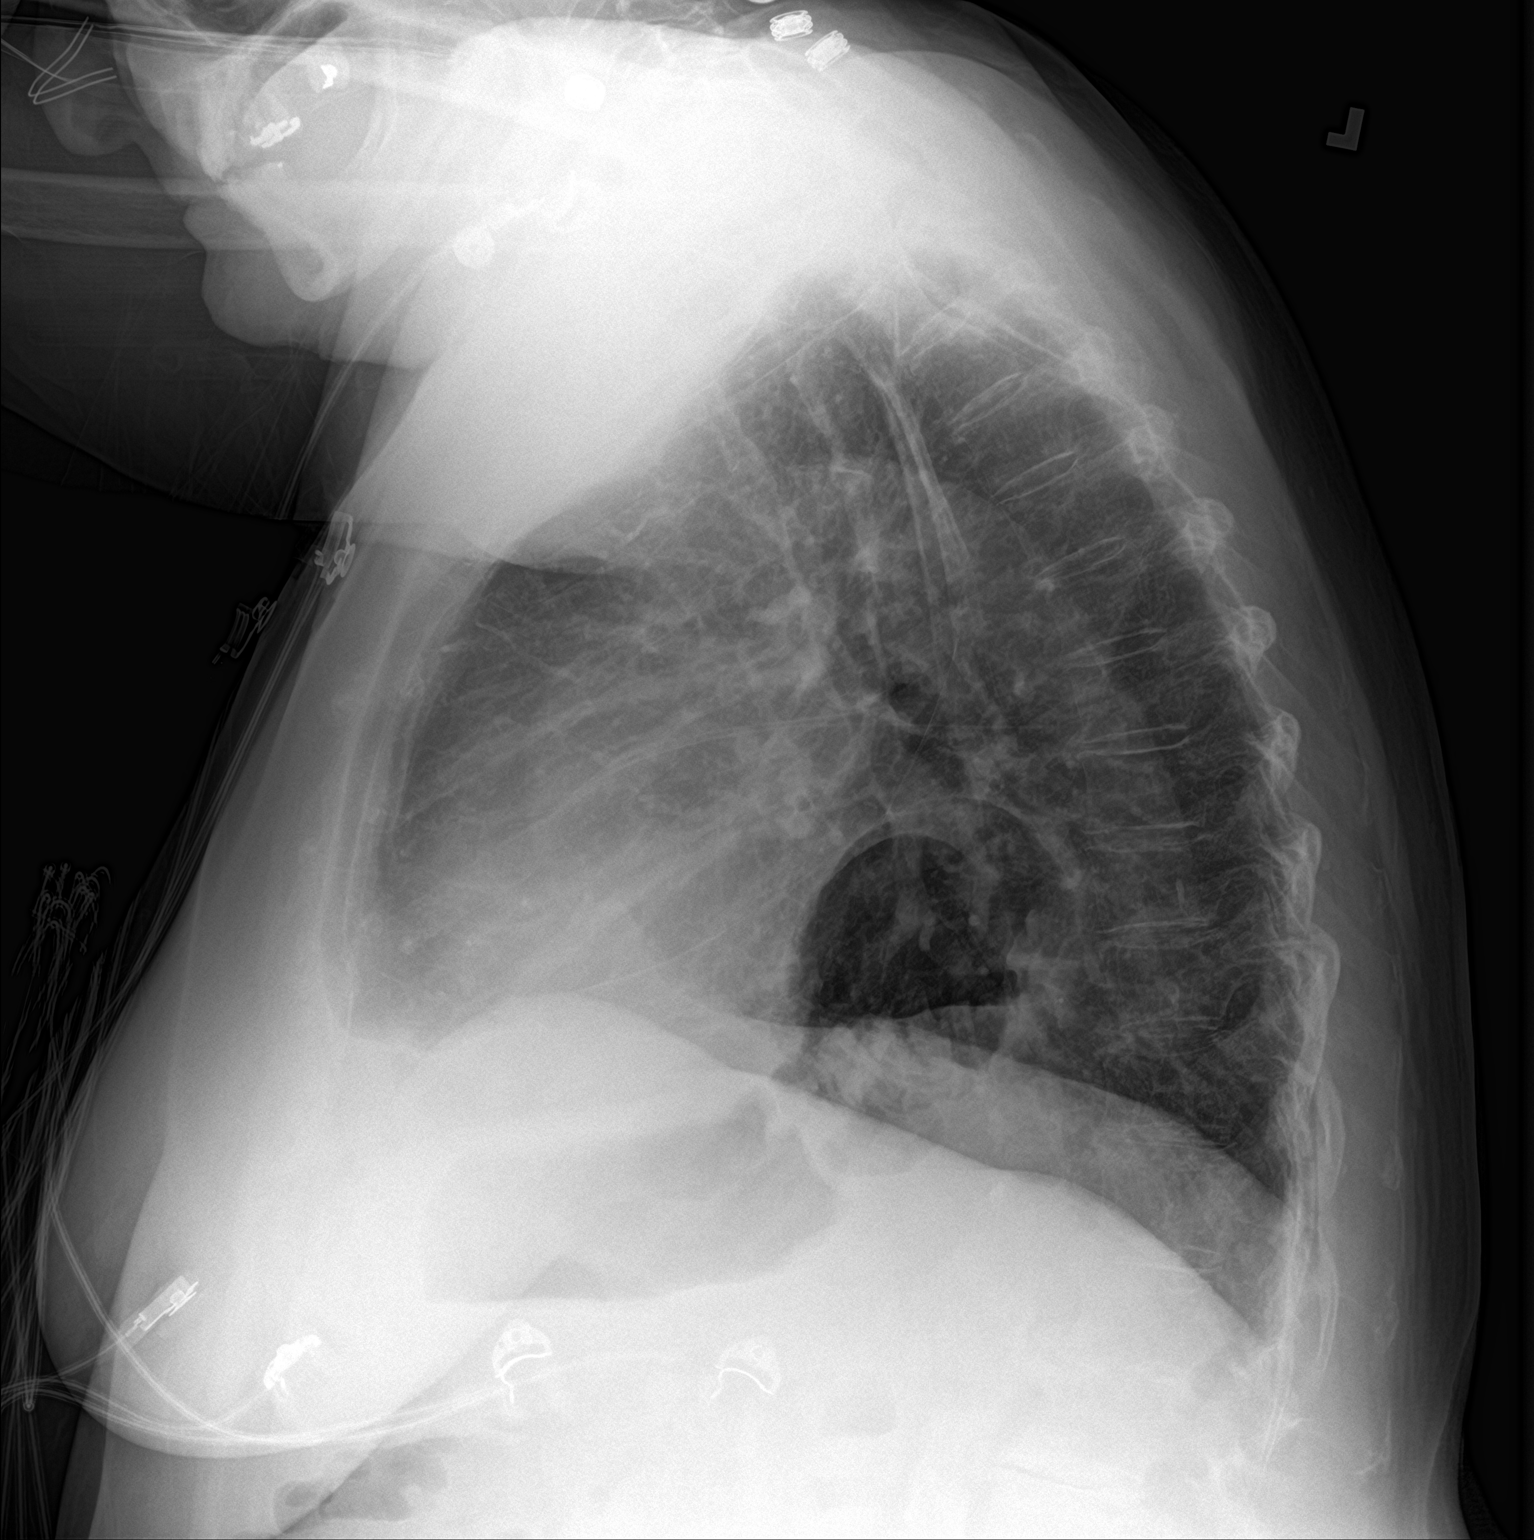

[2 of 2 positions shown; findings below may reference images not displayed]

FINDINGS: Large hiatal hernia. Heart and mediastinal contours are within
normal limits. No focal opacities or effusions. No acute bony
abnormality. Aortic atherosclerosis
IMPRESSION: Large hiatal hernia.  No active cardiopulmonary disease.

## 2022-03-30 DIAGNOSIS — E119 Type 2 diabetes mellitus without complications: Secondary | ICD-10-CM | POA: Diagnosis not present

## 2022-03-30 DIAGNOSIS — D500 Iron deficiency anemia secondary to blood loss (chronic): Secondary | ICD-10-CM | POA: Diagnosis not present

## 2022-03-30 DIAGNOSIS — J302 Other seasonal allergic rhinitis: Secondary | ICD-10-CM | POA: Diagnosis not present

## 2022-03-30 DIAGNOSIS — E785 Hyperlipidemia, unspecified: Secondary | ICD-10-CM | POA: Diagnosis not present

## 2022-03-30 DIAGNOSIS — I10 Essential (primary) hypertension: Secondary | ICD-10-CM | POA: Diagnosis not present

## 2022-03-31 DIAGNOSIS — E875 Hyperkalemia: Secondary | ICD-10-CM | POA: Diagnosis not present

## 2022-04-11 DIAGNOSIS — E875 Hyperkalemia: Secondary | ICD-10-CM | POA: Diagnosis not present

## 2022-04-21 DIAGNOSIS — E875 Hyperkalemia: Secondary | ICD-10-CM | POA: Diagnosis not present

## 2022-07-14 DIAGNOSIS — I10 Essential (primary) hypertension: Secondary | ICD-10-CM | POA: Diagnosis not present

## 2022-07-14 DIAGNOSIS — E119 Type 2 diabetes mellitus without complications: Secondary | ICD-10-CM | POA: Diagnosis not present

## 2022-07-14 DIAGNOSIS — E785 Hyperlipidemia, unspecified: Secondary | ICD-10-CM | POA: Diagnosis not present

## 2022-07-22 DIAGNOSIS — E785 Hyperlipidemia, unspecified: Secondary | ICD-10-CM | POA: Diagnosis not present

## 2022-07-22 DIAGNOSIS — I10 Essential (primary) hypertension: Secondary | ICD-10-CM | POA: Diagnosis not present

## 2022-10-14 DIAGNOSIS — E119 Type 2 diabetes mellitus without complications: Secondary | ICD-10-CM | POA: Diagnosis not present

## 2022-10-14 DIAGNOSIS — E559 Vitamin D deficiency, unspecified: Secondary | ICD-10-CM | POA: Diagnosis not present

## 2022-10-14 DIAGNOSIS — E785 Hyperlipidemia, unspecified: Secondary | ICD-10-CM | POA: Diagnosis not present

## 2022-10-14 DIAGNOSIS — Z9181 History of falling: Secondary | ICD-10-CM | POA: Diagnosis not present

## 2022-10-14 DIAGNOSIS — Z139 Encounter for screening, unspecified: Secondary | ICD-10-CM | POA: Diagnosis not present

## 2023-01-24 DIAGNOSIS — M75101 Unspecified rotator cuff tear or rupture of right shoulder, not specified as traumatic: Secondary | ICD-10-CM | POA: Diagnosis not present

## 2023-01-24 DIAGNOSIS — M12811 Other specific arthropathies, not elsewhere classified, right shoulder: Secondary | ICD-10-CM | POA: Diagnosis not present

## 2023-02-22 DIAGNOSIS — E559 Vitamin D deficiency, unspecified: Secondary | ICD-10-CM | POA: Diagnosis not present

## 2023-02-22 DIAGNOSIS — I10 Essential (primary) hypertension: Secondary | ICD-10-CM | POA: Diagnosis not present

## 2023-02-22 DIAGNOSIS — R7981 Abnormal blood-gas level: Secondary | ICD-10-CM | POA: Diagnosis not present

## 2023-02-22 DIAGNOSIS — E1169 Type 2 diabetes mellitus with other specified complication: Secondary | ICD-10-CM | POA: Diagnosis not present

## 2023-02-22 DIAGNOSIS — E785 Hyperlipidemia, unspecified: Secondary | ICD-10-CM | POA: Diagnosis not present

## 2023-03-06 DIAGNOSIS — R21 Rash and other nonspecific skin eruption: Secondary | ICD-10-CM | POA: Diagnosis not present

## 2023-03-14 DIAGNOSIS — M75101 Unspecified rotator cuff tear or rupture of right shoulder, not specified as traumatic: Secondary | ICD-10-CM | POA: Diagnosis not present

## 2023-03-14 DIAGNOSIS — M12811 Other specific arthropathies, not elsewhere classified, right shoulder: Secondary | ICD-10-CM | POA: Diagnosis not present

## 2023-03-16 DIAGNOSIS — M47812 Spondylosis without myelopathy or radiculopathy, cervical region: Secondary | ICD-10-CM | POA: Diagnosis not present

## 2023-03-28 DIAGNOSIS — M47812 Spondylosis without myelopathy or radiculopathy, cervical region: Secondary | ICD-10-CM | POA: Diagnosis not present

## 2023-03-28 DIAGNOSIS — M5021 Other cervical disc displacement,  high cervical region: Secondary | ICD-10-CM | POA: Diagnosis not present

## 2023-03-28 DIAGNOSIS — M50222 Other cervical disc displacement at C5-C6 level: Secondary | ICD-10-CM | POA: Diagnosis not present

## 2023-03-28 DIAGNOSIS — M4802 Spinal stenosis, cervical region: Secondary | ICD-10-CM | POA: Diagnosis not present

## 2023-04-04 DIAGNOSIS — M47812 Spondylosis without myelopathy or radiculopathy, cervical region: Secondary | ICD-10-CM | POA: Diagnosis not present

## 2023-06-01 DIAGNOSIS — E785 Hyperlipidemia, unspecified: Secondary | ICD-10-CM | POA: Diagnosis not present

## 2023-06-01 DIAGNOSIS — E1169 Type 2 diabetes mellitus with other specified complication: Secondary | ICD-10-CM | POA: Diagnosis not present

## 2023-06-01 DIAGNOSIS — Z23 Encounter for immunization: Secondary | ICD-10-CM | POA: Diagnosis not present

## 2023-06-01 DIAGNOSIS — D649 Anemia, unspecified: Secondary | ICD-10-CM | POA: Diagnosis not present

## 2023-06-01 DIAGNOSIS — M25512 Pain in left shoulder: Secondary | ICD-10-CM | POA: Diagnosis not present

## 2023-06-01 DIAGNOSIS — M25511 Pain in right shoulder: Secondary | ICD-10-CM | POA: Diagnosis not present

## 2023-06-01 DIAGNOSIS — N1831 Chronic kidney disease, stage 3a: Secondary | ICD-10-CM | POA: Diagnosis not present

## 2023-06-01 DIAGNOSIS — J302 Other seasonal allergic rhinitis: Secondary | ICD-10-CM | POA: Diagnosis not present

## 2023-06-21 DIAGNOSIS — I10 Essential (primary) hypertension: Secondary | ICD-10-CM | POA: Diagnosis not present

## 2023-07-06 DIAGNOSIS — M47812 Spondylosis without myelopathy or radiculopathy, cervical region: Secondary | ICD-10-CM | POA: Diagnosis not present

## 2023-08-25 DIAGNOSIS — R6889 Other general symptoms and signs: Secondary | ICD-10-CM | POA: Diagnosis not present

## 2023-11-01 DIAGNOSIS — I959 Hypotension, unspecified: Secondary | ICD-10-CM | POA: Diagnosis not present

## 2023-11-01 DIAGNOSIS — I10 Essential (primary) hypertension: Secondary | ICD-10-CM | POA: Diagnosis not present

## 2023-11-01 DIAGNOSIS — N1831 Chronic kidney disease, stage 3a: Secondary | ICD-10-CM | POA: Diagnosis not present

## 2023-11-01 DIAGNOSIS — E1169 Type 2 diabetes mellitus with other specified complication: Secondary | ICD-10-CM | POA: Diagnosis not present

## 2023-11-10 DIAGNOSIS — R031 Nonspecific low blood-pressure reading: Secondary | ICD-10-CM | POA: Diagnosis not present

## 2023-11-10 DIAGNOSIS — Z139 Encounter for screening, unspecified: Secondary | ICD-10-CM | POA: Diagnosis not present

## 2023-11-10 DIAGNOSIS — D649 Anemia, unspecified: Secondary | ICD-10-CM | POA: Diagnosis not present

## 2023-11-17 DIAGNOSIS — M79606 Pain in leg, unspecified: Secondary | ICD-10-CM | POA: Diagnosis not present

## 2023-11-17 DIAGNOSIS — D649 Anemia, unspecified: Secondary | ICD-10-CM | POA: Diagnosis not present

## 2023-11-17 DIAGNOSIS — I10 Essential (primary) hypertension: Secondary | ICD-10-CM | POA: Diagnosis not present

## 2023-11-21 ENCOUNTER — Ambulatory Visit: Admitting: Gastroenterology

## 2023-11-21 ENCOUNTER — Encounter: Payer: Self-pay | Admitting: Gastroenterology

## 2023-11-21 VITALS — BP 164/64 | HR 74 | Ht 66.0 in | Wt 165.0 lb

## 2023-11-21 DIAGNOSIS — D509 Iron deficiency anemia, unspecified: Secondary | ICD-10-CM

## 2023-11-21 DIAGNOSIS — K219 Gastro-esophageal reflux disease without esophagitis: Secondary | ICD-10-CM

## 2023-11-21 DIAGNOSIS — I959 Hypotension, unspecified: Secondary | ICD-10-CM

## 2023-11-21 MED ORDER — OMEPRAZOLE 40 MG PO CPDR: 40.0000 mg | DELAYED_RELEASE_CAPSULE | Freq: Two times a day (BID) | ORAL | 3 refills | Status: AC

## 2023-11-21 NOTE — Patient Instructions (Addendum)
 You have been scheduled for an endoscopy. Please follow written instructions given to you at your visit today.  If you use inhalers (even only as needed), please bring them with you on the day of your procedure.  If you take any of the following medications, they will need to be adjusted prior to your procedure:   DO NOT TAKE 7 DAYS PRIOR TO TEST- Trulicity (dulaglutide) Ozempic, Wegovy (semaglutide) Mounjaro (tirzepatide) Bydureon Bcise (exanatide extended release)  DO NOT TAKE 1 DAY PRIOR TO YOUR TEST Rybelsus (semaglutide) Adlyxin (lixisenatide) Victoza (liraglutide) Byetta (exanatide) ___________________________________________________________________________  We have sent the following medications to your pharmacy for you to pick up at your convenience: Omeprazole 40 mg   NO NSAIDS  Use Tylenol only for Arthritis pain  Due to recent changes in healthcare laws, you may see the results of your imaging and laboratory studies on MyChart before your provider has had a chance to review them.  We understand that in some cases there may be results that are confusing or concerning to you. Not all laboratory results come back in the same time frame and the provider may be waiting for multiple results in order to interpret others.  Please give [place] 48 hours in order for your provider to thoroughly review all the results before contacting the office for clarification of your results.    I appreciate the  opportunity to care for you  Thank You   Scott Cunningham,MD

## 2023-11-21 NOTE — Progress Notes (Signed)
 HPI : Janice Hester is a 83 y.o. female with a history of diabetes, hypertension and arthritis who is referred to [place] by Aida Puffer, MD for concerns of GI bleeding.   She had seen her PCP Abe People, FNP) on March 19 with a chief complaint of low blood pressure.  She had been having systolic blood pressures at home that were frequently less than 100 for few days.  She had associated dizziness and fatigue.  A CBC taken at that time showed a hemoglobin of 9.8, MCV 85.  A baseline hemoglobin was not given.  Her last CBC in our records was in 2020 and her hemoglobin was 12.  CMP at that time was unremarkable, BUN 23, creatinine 1.23.  Blood pressure at that visit was 142/62, heart rate 90 She was referred to GI at that time and advised to stop taking Advil.  She had been taking Advil daily for months for arthritis pain.  She was seen again by Abe People April 4 with ongoing issues of low blood pressure.  Her blood pressure at that visit was 110/72, pulse 83. Repeat labs at that visit showed a hemoglobin of 9.0, MCV 83, BUN 24, creatinine 1.27. Her antihypertensive medications were stopped at that visit.  Today, the patient reports that she is feeling better.  Her blood pressures have been in the 140s at home.  In the office today, her blood pressure was 164/64. She does report feeling tired and fatigued, but denies any lightheadedness or dizziness.  She denies having any stool changes during this time.  Specifically, she denies any black or tarry stools.  She also denies any hematochezia or dark red stools. She denies any other GI symptoms such as abdominal pain, nausea/vomiting, early satiety/dyspepsia, dysphagia or changes in her bowel habits. She has a formed brown stool daily. She does have occasional heartburn, which has not changed recently.  She is prescribed omeprazole but only takes it a couple times a week, when she eats foods that are more likely to worsen her heartburn.  She has  had heartburn like this for years.  She had been taking Advil on a daily basis for quite some time.  She has not taken it since that initial visit 3 weeks ago.  She now takes Tylenol for arthritis pain.  She has a diagnosis of iron deficiency anemia and apparently underwent EGD and colonoscopy to evaluate this in 2020.    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     glimepiride (AMARYL) 2 MG tablet Take 2 mg  by mouth daily with breakfast.     ibuprofen (ADVIL,MOTRIN) 100 MG tablet Take 100 mg by mouth every 6 (six) hours as needed for fever.     LORazepam (ATIVAN) 1 MG tablet Take 1 mg by mouth every 8 (eight) hours. 1/2 TAB PRN     omeprazole (PRILOSEC) 40 MG capsule Take 1 capsule (40 mg total) by mouth in the morning and at bedtime. 180 capsule 3   sitaGLIPtin-metformin (JANUMET) 50-1000 MG per tablet Take 1 tablet by mouth 2 (two) times daily with a meal.     spironolactone (ALDACTONE) 25 MG tablet Take 25 mg by mouth daily.     No current facility-administered medications for this visit.   Allergies  Allergen Reactions   Codeine     NAUSEA AND FUNNY FEELING      Review of Systems: All systems reviewed and negative except where noted in HPI.    No results found.  Physical Exam: BP (!) 164/64   Pulse 74   Ht 5\' 6"  (1.676 m)   Wt 165 lb (74.8 kg)   BMI 26.63 kg/m  Constitutional: Pleasant,well-developed, Caucasian female in no acute distress.  Accompanied by daughter HEENT: Normocephalic and atraumatic. Conjunctivae are normal. No scleral icterus. Neck supple.  Cardiovascular: Normal rate, regular rhythm.  Pulmonary/chest: Effort normal and breath sounds normal. No wheezing, rales or rhonchi. Abdominal: Soft, nondistended, mild epigastric tenderness, without rigidity or guarding. Bowel sounds active throughout. There are no masses palpable. No hepatomegaly. Extremities: no edema Rectal: Performed with CMA Patty [place] present: No external hemorrhoids or skin tags.  No anal fissure.  Digital rectal exam notable for normal sphincter tone, no rectal mass.  Light brown stool noted within the rectal vault.  Point-of-care fecal occult test negative Neurological: Alert and oriented to person place and time. Skin: Skin is warm and dry. No rashes noted. Psychiatric: Normal mood and affect. Behavior is normal.  CBC    Component Value Date/Time   WBC 6.4 04/02/2019 1115   RBC 3.87 04/02/2019 1115   HGB 12.1 04/02/2019 1115   HCT 37.2 04/02/2019 1115   PLT 265 04/02/2019 1115   MCV 96.1 04/02/2019 1115   MCH 31.3 04/02/2019 1115   MCHC 32.5 04/02/2019 1115   RDW 12.7 04/02/2019 1115    CMP     Component Value Date/Time   NA 135 04/02/2019 1115   K 5.2 (H) 04/02/2019 1115   CL 102 04/02/2019 1115   CO2 23 04/02/2019 1115   GLUCOSE 277 (H) 04/02/2019 1115   BUN 23 04/02/2019 1115   CREATININE 1.44 (H) 04/02/2019 1115   CALCIUM 9.0 04/02/2019 1115   PROT 6.7 04/02/2019 1115   ALBUMIN 3.9 04/02/2019 1115   AST 24 04/02/2019 1115   ALT 16 04/02/2019 1115   ALKPHOS 94 04/02/2019 1115   BILITOT 0.7 04/02/2019 1115   GFRNONAA 34 (L)  04/02/2019 1115   GFRAA 40 (L) 04/02/2019 1115       Latest Ref Rng & Units 04/02/2019   11:15 AM  CBC EXTENDED  WBC 4.0 - 10.5 K/uL 6.4   RBC 3.87 - 5.11 MIL/uL 3.87   Hemoglobin 12.0 - 15.0 g/dL 29.5   HCT 62.1 - 30.8 % 37.2   Platelets 150 - 400 K/uL 265       ASSESSMENT AND PLAN:  84 year old female with history of diabetes, hypertension and arthritis with recent low blood pressure and anemia.  Her most recent hemoglobin baseline is unclear as we do not have  these records, but her most recent baseline that I can see is 12.  She had a hemoglobin of 9 on Friday.  She has not had any overt bleeding, and stool in the rectal vault today was light brown with negative fecal occult blood test. She had been taking Advil daily for quite some time until few weeks ago.  Microcytic anemia, suspect iron deficiency Chronic iron deficiency anemia with recent hemoglobin drop to 9 g/dL. Symptoms include weakness, without dizziness or lightheadedness. No gastrointestinal bleeding on rectal exam; stool appeared brown. Likely related to recent NSAID use, now discontinued.  Based on patient's resolution of low blood pressure and normal-appearing stool, low suspicion for any active bleeding.  An upper endoscopy is warranted to evaluate for potential bleeding sources such as ulcers from NSAIDs. - Schedule upper endoscopy to evaluate potential bleeding sources - Instruct to take omeprazole twice daily until the procedure - Advise to avoid NSAIDs and continue acetaminophen for arthritis pain - If EGD normal, continue to trend CBC, consider capsule endoscopy or repeat colonoscopy if hemoglobin not improving - Recommend patient take omeprazole twice daily EGD for possible peptic ulcer disease  Gastroesophageal Reflux Disease (GERD) GERD with symptoms 3-4 times per week, managed with omeprazole as needed. Symptoms exacerbated by certain foods. Omeprazole to be taken twice daily until upper endoscopy to manage  symptoms and prevent potential bleeding. - Instruct to take omeprazole twice daily until the upper endoscopy - Advise dietary modifications to avoid trigger foods  The details, risks (including bleeding, perforation, infection, missed lesions, medication reactions and possible hospitalization or surgery if complications occur), benefits, and alternatives to EGD with possible biopsy and possible dilation were discussed with the patient and she consents to proceed.   Kensington Rios E. Tomasa Rand, MD [facility]     Aida Puffer, MD

## 2023-11-24 DIAGNOSIS — I959 Hypotension, unspecified: Secondary | ICD-10-CM | POA: Diagnosis not present

## 2023-11-24 DIAGNOSIS — R531 Weakness: Secondary | ICD-10-CM | POA: Diagnosis not present

## 2023-11-24 DIAGNOSIS — D649 Anemia, unspecified: Secondary | ICD-10-CM | POA: Diagnosis not present

## 2023-11-24 DIAGNOSIS — E119 Type 2 diabetes mellitus without complications: Secondary | ICD-10-CM | POA: Diagnosis not present

## 2023-11-24 DIAGNOSIS — Z79899 Other long term (current) drug therapy: Secondary | ICD-10-CM | POA: Diagnosis not present

## 2023-11-24 DIAGNOSIS — R42 Dizziness and giddiness: Secondary | ICD-10-CM | POA: Diagnosis not present

## 2023-11-24 DIAGNOSIS — I10 Essential (primary) hypertension: Secondary | ICD-10-CM | POA: Diagnosis not present

## 2023-11-24 DIAGNOSIS — K921 Melena: Secondary | ICD-10-CM | POA: Diagnosis not present

## 2023-11-24 DIAGNOSIS — M2550 Pain in unspecified joint: Secondary | ICD-10-CM | POA: Diagnosis not present

## 2023-12-12 ENCOUNTER — Encounter: Payer: Self-pay | Admitting: Gastroenterology

## 2023-12-19 ENCOUNTER — Ambulatory Visit: Admitting: Gastroenterology

## 2023-12-19 ENCOUNTER — Encounter: Payer: Self-pay | Admitting: Gastroenterology

## 2023-12-19 VITALS — BP 157/79 | HR 66 | Temp 97.2°F | Resp 19 | Ht 66.0 in | Wt 165.0 lb

## 2023-12-19 DIAGNOSIS — K449 Diaphragmatic hernia without obstruction or gangrene: Secondary | ICD-10-CM | POA: Diagnosis not present

## 2023-12-19 DIAGNOSIS — K2970 Gastritis, unspecified, without bleeding: Secondary | ICD-10-CM

## 2023-12-19 DIAGNOSIS — K3189 Other diseases of stomach and duodenum: Secondary | ICD-10-CM

## 2023-12-19 DIAGNOSIS — K219 Gastro-esophageal reflux disease without esophagitis: Secondary | ICD-10-CM

## 2023-12-19 DIAGNOSIS — K319 Disease of stomach and duodenum, unspecified: Secondary | ICD-10-CM | POA: Diagnosis not present

## 2023-12-19 DIAGNOSIS — D509 Iron deficiency anemia, unspecified: Secondary | ICD-10-CM

## 2023-12-19 MED ORDER — INSULIN REGULAR HUMAN 100 UNIT/ML IJ SOLN
6.0000 [IU] | Freq: Once | INTRAMUSCULAR | Status: AC
  Administered 2023-12-19: 6 [IU] via SUBCUTANEOUS

## 2023-12-19 MED ORDER — SODIUM CHLORIDE 0.9 % IV SOLN: 500.0000 mL | Freq: Once | INTRAVENOUS | Status: DC

## 2023-12-19 NOTE — Progress Notes (Signed)
 A/o x 3, VSS, gd SR's, pleased with anesthesia, report to RN

## 2023-12-19 NOTE — Progress Notes (Signed)
 BG recheck [phone]

## 2023-12-19 NOTE — Progress Notes (Signed)
 History and Physical Interval Note:  12/19/2023 10:42 AM  Janice Hester  has presented today for endoscopic procedure(s), with the diagnosis of  Encounter Diagnoses  Name Primary?   Iron deficiency anemia, unspecified iron deficiency anemia type Yes   Gastroesophageal reflux disease, unspecified whether esophagitis present   .  The various methods of evaluation and treatment have been discussed with the patient and/or family. After consideration of risks, benefits and other options for treatment, the patient has consented to  the endoscopic procedure(s).   The patient's history has been reviewed, patient examined, no change in status, stable for endoscopic procedure(s).  I have reviewed the patient's chart and labs.  Questions were answered to the patient's satisfaction.     Suleman Gunning E. Cherryl Corona, MD [facility]

## 2023-12-19 NOTE — Progress Notes (Signed)
 Pt's states no medical or surgical changes since previsit or office visit.

## 2023-12-19 NOTE — Op Note (Signed)
 [facility] Patient Name: Janice Hester Procedure Date: 12/19/2023 10:20 AM MRN: [id number] Endoscopist: Geralyn Knee E. Cherryl Corona , MD, [id number] Age: 83 Referring MD:  Date of Birth: February 28, 1941 Gender: Female Account #: [id number] Procedure:                Upper GI endoscopy Indications:              Iron deficiency anemia Medicines:                Monitored Anesthesia Care Procedure:                Pre-Anesthesia Assessment:                           - Prior to the procedure, a History and Physical                            was performed, and patient medications and                            allergies were reviewed. The patient's tolerance of                            previous anesthesia was also reviewed. The risks                            and benefits of the procedure and the sedation                            options and risks were discussed with the patient.                            All questions were answered, and informed consent                            was obtained. Prior Anticoagulants: The patient has                            taken no anticoagulant or antiplatelet agents. ASA                            Grade Assessment: III - A patient with severe                            systemic disease. After reviewing the risks and                            benefits, the patient was deemed in satisfactory                            condition to undergo the procedure.                           After obtaining informed consent, the endoscope was  passed under direct vision. Throughout the                            procedure, the patient's blood pressure, pulse, and                            oxygen saturations were monitored continuously. The                            GIF [id number] #[id number] was introduced through the                            mouth, and advanced to the second part of duodenum.                            The upper GI endoscopy was  accomplished without                            difficulty. The patient tolerated the procedure                            well. Scope In: Scope Out: Findings:                 The examined portions of the nasopharynx,                            oropharynx and larynx were normal.                           The examined esophagus was normal.                           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 The                            proximal extent of the gastric folds (end of                            tubular esophagus) was 30 cm from the incisors. The                            hiatal narrowing was 35 cm from the incisors.                           Diffuse mild inflammation characterized by erythema                            was found in the gastric antrum. Biopsies were                            taken with a cold forceps for Helicobacter pylori                            testing. Estimated blood  loss was minimal.                           The examined duodenum was normal. Complications:            No immediate complications. Estimated Blood Loss:     Estimated blood loss was minimal. Impression:               - The examined portions of the nasopharynx,                            oropharynx and larynx were normal.                           - Normal esophagus.                           - Large paraesophageal hernia.                           - Gastritis. Biopsied.                           - Normal examined duodenum.                           - No obvious sources of iron deficiency, although                            presence of large paraesophageal hernia makes                            Cameron's lesion possible, but no such lesions                            found on today's exam. Recommendation:           - Patient has a contact number available for                            emergencies. The signs and symptoms of potential                            delayed complications  were discussed with the                            patient. Return to normal activities tomorrow.                            Written discharge instructions were provided to the                            patient.                           - Resume previous diet.                           -  Continue present medications.                           - Await pathology results.                           - Repeat CBC and iron panel in 1 month. This can be                            done through patient's PCP.                           - Continue to avoid NSAIDs. Use Tylenol for                            arthritis pain                           - Consider capsule endoscopy if anemia not improving Naydelin Ziegler E. Cherryl Corona, MD 12/19/2023 11:52:27 AM This report has been signed electronically.

## 2023-12-19 NOTE — Progress Notes (Signed)
 Called to room to assist during endoscopic procedure.  Patient ID and intended procedure confirmed with present staff. Received instructions for my participation in the procedure from the performing physician.

## 2023-12-19 NOTE — Patient Instructions (Signed)
 Educational handout provided to patient related to Gastritis  Resume previous diet  Continue present medications- Once a day omeprazole  per Dr. Cherryl Corona  Continue to avoid NSAIDS- use TYLENOL for arthritis pain  Awaiting pathology results   YOU HAD AN ENDOSCOPIC PROCEDURE TODAY AT THE [facility]:   Refer to the procedure report that was given to you for any specific questions about what was found during the examination.  If the procedure report does not answer your questions, please call your gastroenterologist to clarify.  If you requested that your care partner not be given the details of your procedure findings, then the procedure report has been included in a sealed envelope for you to review at your convenience later.  YOU SHOULD EXPECT: Some feelings of bloating in the abdomen. Passage of more gas than usual.  Walking can help get rid of the air that was put into your GI tract during the procedure and reduce the bloating. If you had a lower endoscopy (such as a colonoscopy or flexible sigmoidoscopy) you may notice spotting of blood in your stool or on the toilet paper. If you underwent a bowel prep for your procedure, you may not have a normal bowel movement for a few days.  Please Note:  You might notice some irritation and congestion in your nose or some drainage.  This is from the oxygen used during your procedure.  There is no need for concern and it should clear up in a day or so.  SYMPTOMS TO REPORT IMMEDIATELY:  Following upper endoscopy (EGD)  Vomiting of blood or coffee ground material  New chest pain or pain under the shoulder blades  Painful or persistently difficult swallowing  New shortness of breath  Fever of 100F or higher  Black, tarry-looking stools  For urgent or emergent issues, a gastroenterologist can be reached at any hour by calling [phone]. Do not use MyChart messaging for urgent concerns.    DIET:  We do recommend a small meal at  first, but then you may proceed to your regular diet.  Drink plenty of fluids but you should avoid alcoholic beverages for 24 hours.  ACTIVITY:  You should plan to take it easy for the rest of today and you should NOT DRIVE or use heavy machinery until tomorrow (because of the sedation medicines used during the test).    FOLLOW UP: Our staff will call the number listed on your records the next business day following your procedure.  We will call around 7:15- 8:00 am to check on you and address any questions or concerns that you may have regarding the information given to you following your procedure. If we do not reach you, we will leave a message.     If any biopsies were taken you will be contacted by phone or by letter within the next 1-3 weeks.  Please call us  at [phone] if you have not heard about the biopsies in 3 weeks.    SIGNATURES/CONFIDENTIALITY: You and/or your care partner have signed paperwork which will be entered into your electronic medical record.  These signatures attest to the fact that that the information above on your After Visit Summary has been reviewed and is understood.  Full responsibility of the confidentiality of this discharge information lies with you and/or your care-partner.

## 2023-12-20 ENCOUNTER — Telehealth: Payer: Self-pay | Admitting: *Deleted

## 2023-12-20 NOTE — Telephone Encounter (Signed)
Mailbox full ,unable to leave message on f/u call 

## 2023-12-21 LAB — SURGICAL PATHOLOGY

## 2023-12-24 ENCOUNTER — Encounter: Payer: Self-pay | Admitting: Gastroenterology

## 2024-01-05 ENCOUNTER — Encounter: Payer: Self-pay | Admitting: Nurse Practitioner

## 2024-01-05 ENCOUNTER — Other Ambulatory Visit (INDEPENDENT_AMBULATORY_CARE_PROVIDER_SITE_OTHER)

## 2024-01-05 ENCOUNTER — Ambulatory Visit: Payer: Self-pay | Admitting: Nurse Practitioner

## 2024-01-05 ENCOUNTER — Ambulatory Visit: Admitting: Nurse Practitioner

## 2024-01-05 VITALS — BP 116/64 | HR 80 | Ht 62.75 in | Wt 168.4 lb

## 2024-01-05 DIAGNOSIS — K449 Diaphragmatic hernia without obstruction or gangrene: Secondary | ICD-10-CM

## 2024-01-05 DIAGNOSIS — D509 Iron deficiency anemia, unspecified: Secondary | ICD-10-CM

## 2024-01-05 DIAGNOSIS — K219 Gastro-esophageal reflux disease without esophagitis: Secondary | ICD-10-CM

## 2024-01-05 DIAGNOSIS — K3189 Other diseases of stomach and duodenum: Secondary | ICD-10-CM

## 2024-01-05 DIAGNOSIS — E119 Type 2 diabetes mellitus without complications: Secondary | ICD-10-CM

## 2024-01-05 DIAGNOSIS — Z7985 Long-term (current) use of injectable non-insulin antidiabetic drugs: Secondary | ICD-10-CM | POA: Diagnosis not present

## 2024-01-05 LAB — IBC + FERRITIN
Ferritin: 11.2 ng/mL (ref 10.0–291.0)
Iron: 35 ug/dL — ABNORMAL LOW (ref 42–145)
Saturation Ratios: 7.1 % — ABNORMAL LOW (ref 20.0–50.0)
TIBC: 494.2 ug/dL — ABNORMAL HIGH (ref 250.0–450.0)
Transferrin: 353 mg/dL (ref 212.0–360.0)

## 2024-01-05 LAB — CBC
HCT: 32.5 % — ABNORMAL LOW (ref 36.0–46.0)
Hemoglobin: 10.7 g/dL — ABNORMAL LOW (ref 12.0–15.0)
MCHC: 32.9 g/dL (ref 30.0–36.0)
MCV: 80.7 fl (ref 78.0–100.0)
Platelets: 295 10*3/uL (ref 150.0–400.0)
RBC: 4.02 Mil/uL (ref 3.87–5.11)
RDW: 17.4 % — ABNORMAL HIGH (ref 11.5–15.5)
WBC: 5.3 10*3/uL (ref 4.0–10.5)

## 2024-01-05 NOTE — Progress Notes (Signed)
 01/05/2024 Janice Hester [id number] 1940/12/09   Chief Complaint: Fatigue, anemia follow up  History of Present Illness: Janice Hester is an 83 year old female with a past medical history of hypertension, osteoarthritis, diabetes mellitus type 2, GERD and iron deficiency anemia.  She was initially seen in office by Dr. Cherryl Corona 11/21/2023 for further evaluation regarding iron deficiency anemia.  At that time her hemoglobin level was 9.0.  Rectal exam showed light brown stool which was guaiac negative.  An EGD was done 12/19/2023 which showed a large paraesophageal hernia and gastritis. Biopsy results showed mild reactive gastropathy without evidence of H. Pylori.  The duodenum was normal. She presents today for further follow-up.  She is accompanied by her daughter.  She continues to take ferrous sulfate 325 mg once daily.  She denies having any further significant fatigue.  No abdominal pain.  She is passing normal formed brown bowel movement most days.  No bloody or black stools.  Her most recent colonoscopy by Dr. Randal Bury in [place] was 08/2018 which showed diverticulosis, no polyps.  PAST GI PROCEDURES:  EGD 12/19/2023: - The examined portions of the nasopharynx, oropharynx and larynx were normal.  - Normal esophagus.  - Large paraesophageal hernia.  - Gastritis. Biopsied.  - Normal examined duodenum.  - No obvious sources of iron deficiency, although presence of large paraesophageal hernia makes Cameron's lesion possible, but no such lesions found on today's exam. 1. Surgical [P], gastric :       - MILD REACTIVE GASTROPATHY.       - NEGATIVE FOR H. PYLORI ON H&E STAIN       - NO INTESTINAL METAPLASIA, DYSPLASIA, OR MALIGNANCY.   EGD August 24, 2018 Hiatal hernia   Colonoscopy August 24, 2018 Indication: GI bleeding Diverticulosis  Current Outpatient Medications on File Prior to Visit  Medication Sig Dispense Refill   acetaminophen (TYLENOL) 500 MG tablet Take 500-1,000 mg by  mouth as needed.     Dulaglutide (TRULICITY) 0.75 MG/0.5ML SOAJ Inject 1.5 mg into the skin once a week.     irbesartan (AVAPRO) 150 MG tablet Take 150 mg by mouth daily.     LORazepam (ATIVAN) 1 MG tablet Take 0.5 tablets by mouth at bedtime.     omeprazole  (PRILOSEC) 40 MG capsule Take 1 capsule (40 mg total) by mouth in the morning and at bedtime. (Patient taking differently: Take 40 mg by mouth daily.) 180 capsule 3   spironolactone-hydrochlorothiazide (ALDACTAZIDE) 25-25 MG tablet Take 1 tablet by mouth daily.     glimepiride (AMARYL) 2 MG tablet Take 2 mg by mouth daily with breakfast. (Patient not taking: Reported on 01/05/2024)     No current facility-administered medications on file prior to visit.   Allergies  Allergen Reactions   Codeine Other (See Comments)    NAUSEA AND FUNNY FEELING   Current Medications, Allergies, Past Medical History, Past Surgical History, Family History and Social History were reviewed in [employer] record.  Review of Systems:   Constitutional: Negative for fever, sweats, chills or weight loss.  Respiratory: Negative for shortness of breath.   Cardiovascular: Negative for chest pain, palpitations and leg swelling.  Gastrointestinal: See HPI.  Musculoskeletal: Negative for back pain or muscle aches.  Neurological: Negative for dizziness, headaches or paresthesias.   Physical Exam: BP 116/64 (BP Location: Left Arm, Patient Position: Sitting, Cuff Size: Normal)   Pulse 80   Ht 5' 2.75" (1.594 m)   Wt 168 lb 6 oz (  76.4 kg)   BMI 30.06 kg/m  General: 83 year old female in no acute distress. Head: Normocephalic and atraumatic. Eyes: No scleral icterus. Conjunctiva pink . Ears: Normal auditory acuity. Mouth: Dentition intact. No ulcers or lesions.  Lungs: Clear throughout to auscultation. Heart: Regular rate and rhythm, no murmur. Abdomen: Soft, nontender and nondistended. No masses or hepatomegaly. Normal bowel sounds x 4  quadrants.  Rectal: Deferred. Musculoskeletal: Symmetrical with no gross deformities. Extremities: Mild bilateral lower extremity edema Neurological: Alert oriented x 4. No focal deficits.  Psychological: Alert and cooperative. Normal mood and affect  Assessment and Recommendations:  83 year old female with iron deficiency anemia.  No overt GI bleeding.  Stool was guaiac negative per prior rectal exam.  EGD 12/19/2023 showed a large paraesophageal hernia without evidence of Donelda Fujita lesions and showed mild reactive gastropathy without evidence of H. pylori.  Etiology for IDA remains unclear, however, there is some literature that indicates IDA may be secondary to a paraesophageal hernia without obvious Donelda Fujita lesions/active GI bleeding.  Colonoscopy 08/2018 showed diverticulosis, no polyps. - CBC, IBC + ferritin panel  - Continue ferrous sulfate 325 mg once daily - Consider surgical consult for paraesophageal hernia repair - Consider CTAP to rule out intra-abdominal/pelvic pathology to explain IDA - Recommend hematology consult if anemia persists or worsens - Further recommendations to be determined after the above labs results reviewed  GERD - Continue Omeprazole  40 mg daily  DM type II

## 2024-01-05 NOTE — Patient Instructions (Signed)
 Your provider has requested that you go to the basement level for lab work before leaving today. Press "B" on the elevator. The lab is located at the first door on the left as you exit the elevator.   _______________________________________________________  If your blood pressure at your visit was 140/90 or greater, please contact your primary care physician to follow up on this.  _______________________________________________________  If you are age 84 or older, your body mass index should be between 23-30. Your Body mass index is 30.06 kg/m. If this is out of the aforementioned range listed, please consider follow up with your Primary Care Provider.  If you are age 67 or younger, your body mass index should be between 19-25. Your Body mass index is 30.06 kg/m. If this is out of the aformentioned range listed, please consider follow up with your Primary Care Provider.   ________________________________________________________  The [facility] providers would like to encourage you to use MYCHART to communicate with providers for non-urgent requests or questions.  Due to long hold times on the telephone, sending your provider a message by [facility] may be a faster and more efficient way to get a response.  Please allow 48 business hours for a response.  Please remember that this is for non-urgent requests.  _______________________________________________________

## 2024-01-23 NOTE — Progress Notes (Signed)
 Agree with the assessment and plan as outlined by Everett Hitt, NP.  Would hold off on surgical referral given patient's age, absence of significant symptoms and unclear role in her anemia.  Agree with continued iron supplementation, monitoring of CBC.  Recommend VCE if hgb drops again   Daje Stark E. Cherryl Corona, MD [facility]

## 2024-02-06 NOTE — Telephone Encounter (Signed)
 Contacted patient to remind her she should have labs this week or next. She verbalizes understanding.

## 2024-02-15 ENCOUNTER — Ambulatory Visit: Payer: Self-pay | Admitting: Nurse Practitioner

## 2024-02-15 ENCOUNTER — Other Ambulatory Visit (INDEPENDENT_AMBULATORY_CARE_PROVIDER_SITE_OTHER)

## 2024-02-15 DIAGNOSIS — D509 Iron deficiency anemia, unspecified: Secondary | ICD-10-CM | POA: Diagnosis not present

## 2024-02-15 LAB — IBC + FERRITIN
Ferritin: 13.8 ng/mL (ref 10.0–291.0)
Iron: 127 ug/dL (ref 42–145)
Saturation Ratios: 25.7 % (ref 20.0–50.0)
TIBC: 494.2 ug/dL — ABNORMAL HIGH (ref 250.0–450.0)
Transferrin: 353 mg/dL (ref 212.0–360.0)

## 2024-02-15 LAB — CBC WITH DIFFERENTIAL/PLATELET
Basophils Absolute: 0 10*3/uL (ref 0.0–0.1)
Basophils Relative: 0.7 % (ref 0.0–3.0)
Eosinophils Absolute: 0.2 10*3/uL (ref 0.0–0.7)
Eosinophils Relative: 3.3 % (ref 0.0–5.0)
HCT: 32.5 % — ABNORMAL LOW (ref 36.0–46.0)
Hemoglobin: 10.8 g/dL — ABNORMAL LOW (ref 12.0–15.0)
Lymphocytes Relative: 18.2 % (ref 12.0–46.0)
Lymphs Abs: 0.8 10*3/uL (ref 0.7–4.0)
MCHC: 33.2 g/dL (ref 30.0–36.0)
MCV: 81.7 fl (ref 78.0–100.0)
Monocytes Absolute: 0.4 10*3/uL (ref 0.1–1.0)
Monocytes Relative: 9.3 % (ref 3.0–12.0)
Neutro Abs: 3.1 10*3/uL (ref 1.4–7.7)
Neutrophils Relative %: 68.5 % (ref 43.0–77.0)
Platelets: 287 10*3/uL (ref 150.0–400.0)
RBC: 3.98 Mil/uL (ref 3.87–5.11)
RDW: 18 % — ABNORMAL HIGH (ref 11.5–15.5)
WBC: 4.6 10*3/uL (ref 4.0–10.5)

## 2024-02-17 LAB — TISSUE TRANSGLUTAMINASE, IGA: (tTG) Ab, IgA: <1 U/mL

## 2024-02-17 LAB — IGA: Immunoglobulin A: 359 mg/dL — ABNORMAL HIGH (ref 70–320)

## 2024-05-23 ENCOUNTER — Other Ambulatory Visit (INDEPENDENT_AMBULATORY_CARE_PROVIDER_SITE_OTHER)

## 2024-05-23 DIAGNOSIS — D509 Iron deficiency anemia, unspecified: Secondary | ICD-10-CM | POA: Diagnosis not present

## 2024-05-23 LAB — CBC WITH DIFFERENTIAL/PLATELET
Basophils Absolute: 0 K/uL (ref 0.0–0.1)
Basophils Relative: 0.6 % (ref 0.0–3.0)
Eosinophils Absolute: 0.2 K/uL (ref 0.0–0.7)
Eosinophils Relative: 3 % (ref 0.0–5.0)
HCT: 35.6 % — ABNORMAL LOW (ref 36.0–46.0)
Hemoglobin: 11.7 g/dL — ABNORMAL LOW (ref 12.0–15.0)
Lymphocytes Relative: 20.4 % (ref 12.0–46.0)
Lymphs Abs: 1 K/uL (ref 0.7–4.0)
MCHC: 32.8 g/dL (ref 30.0–36.0)
MCV: 88.7 fl (ref 78.0–100.0)
Monocytes Absolute: 0.4 K/uL (ref 0.1–1.0)
Monocytes Relative: 8.4 % (ref 3.0–12.0)
Neutro Abs: 3.4 K/uL (ref 1.4–7.7)
Neutrophils Relative %: 67.6 % (ref 43.0–77.0)
Platelets: 274 K/uL (ref 150.0–400.0)
RBC: 4.02 Mil/uL (ref 3.87–5.11)
RDW: 14.8 % (ref 11.5–15.5)
WBC: 5 K/uL (ref 4.0–10.5)

## 2024-05-23 LAB — IBC + FERRITIN
Ferritin: 14.2 ng/mL (ref 10.0–291.0)
Iron: 71 ug/dL (ref 42–145)
Saturation Ratios: 15.1 % — ABNORMAL LOW (ref 20.0–50.0)
TIBC: 470.4 ug/dL — ABNORMAL HIGH (ref 250.0–450.0)
Transferrin: 336 mg/dL (ref 212.0–360.0)

## 2024-05-30 ENCOUNTER — Other Ambulatory Visit: Payer: Self-pay

## 2024-05-30 ENCOUNTER — Ambulatory Visit: Payer: Self-pay | Admitting: Nurse Practitioner

## 2024-05-30 DIAGNOSIS — D509 Iron deficiency anemia, unspecified: Secondary | ICD-10-CM

## 2024-05-30 NOTE — Telephone Encounter (Signed)
 Patient returning phone call. Please advise, thank you
